# Patient Record
Sex: Female | Born: 1965 | Race: White | Hispanic: No | Marital: Married | Smoking: Never smoker
Health system: Southern US, Community
[De-identification: ages and names within clinical notes are randomized; demographics above are authoritative.]

## PROBLEM LIST (undated history)

## (undated) DIAGNOSIS — J189 Pneumonia, unspecified organism: Secondary | ICD-10-CM

## (undated) DIAGNOSIS — Z9889 Other specified postprocedural states: Secondary | ICD-10-CM

## (undated) DIAGNOSIS — T8859XA Other complications of anesthesia, initial encounter: Secondary | ICD-10-CM

## (undated) DIAGNOSIS — N809 Endometriosis, unspecified: Secondary | ICD-10-CM

## (undated) DIAGNOSIS — R112 Nausea with vomiting, unspecified: Secondary | ICD-10-CM

## (undated) DIAGNOSIS — T4145XA Adverse effect of unspecified anesthetic, initial encounter: Secondary | ICD-10-CM

## (undated) HISTORY — DX: Morbid (severe) obesity due to excess calories: E66.01

## (undated) HISTORY — PX: TONSILLECTOMY: SUR1361

## (undated) HISTORY — PX: APPENDECTOMY: SHX54

## (undated) HISTORY — DX: Endometriosis, unspecified: N80.9

## (undated) HISTORY — PX: LAPAROTOMY: SHX154

---

## 1999-04-30 ENCOUNTER — Other Ambulatory Visit: Admission: RE | Admit: 1999-04-30 | Discharge: 1999-04-30 | Payer: Self-pay | Admitting: Family Medicine

## 2006-06-24 ENCOUNTER — Ambulatory Visit: Payer: Self-pay | Admitting: Family Medicine

## 2006-06-24 LAB — CONVERTED CEMR LAB
AST: 16 units/L (ref 0–37)
BUN: 10 mg/dL (ref 6–23)
CO2: 28 meq/L (ref 19–32)
Chloride: 101 meq/L (ref 96–112)
Creatinine, Ser: 0.7 mg/dL (ref 0.4–1.2)
Eosinophils Absolute: 0.1 10*3/uL (ref 0.0–0.6)
Eosinophils Relative: 1 % (ref 0.0–5.0)
Folate: 15.1 ng/mL
GFR calc Af Amer: 119 mL/min
GFR calc non Af Amer: 99 mL/min
Lymphocytes Relative: 24.8 % (ref 12.0–46.0)
MCHC: 34.4 g/dL (ref 30.0–36.0)
MCV: 88.3 fL (ref 78.0–100.0)
Neutro Abs: 4.7 10*3/uL (ref 1.4–7.7)
Platelets: 261 10*3/uL (ref 150–400)
Potassium: 4 meq/L (ref 3.5–5.1)
Sodium: 135 meq/L (ref 135–145)
Total Bilirubin: 0.9 mg/dL (ref 0.3–1.2)
Total CHOL/HDL Ratio: 5.6
Total Protein: 6.8 g/dL (ref 6.0–8.3)
VLDL: 22 mg/dL (ref 0–40)
Vitamin B-12: 207 pg/mL — ABNORMAL LOW (ref 211–911)

## 2006-07-05 ENCOUNTER — Encounter: Admission: RE | Admit: 2006-07-05 | Discharge: 2006-07-05 | Payer: Self-pay | Admitting: Family Medicine

## 2006-07-13 ENCOUNTER — Other Ambulatory Visit: Admission: RE | Admit: 2006-07-13 | Discharge: 2006-07-13 | Payer: Self-pay | Admitting: Family Medicine

## 2006-07-13 ENCOUNTER — Ambulatory Visit: Payer: Self-pay | Admitting: Family Medicine

## 2006-07-13 ENCOUNTER — Encounter: Payer: Self-pay | Admitting: Family Medicine

## 2006-07-13 LAB — CONVERTED CEMR LAB: Homocysteine: 8.2 umol/L

## 2007-04-06 ENCOUNTER — Ambulatory Visit: Payer: Self-pay | Admitting: Internal Medicine

## 2007-04-06 DIAGNOSIS — J0100 Acute maxillary sinusitis, unspecified: Secondary | ICD-10-CM | POA: Insufficient documentation

## 2007-04-06 DIAGNOSIS — J019 Acute sinusitis, unspecified: Secondary | ICD-10-CM | POA: Insufficient documentation

## 2008-02-11 ENCOUNTER — Telehealth: Payer: Self-pay | Admitting: Internal Medicine

## 2008-02-13 ENCOUNTER — Encounter: Payer: Self-pay | Admitting: Emergency Medicine

## 2008-02-13 ENCOUNTER — Telehealth: Payer: Self-pay | Admitting: Family Medicine

## 2008-02-14 ENCOUNTER — Encounter: Payer: Self-pay | Admitting: Family Medicine

## 2008-02-14 ENCOUNTER — Inpatient Hospital Stay (HOSPITAL_COMMUNITY): Admission: EM | Admit: 2008-02-14 | Discharge: 2008-02-19 | Payer: Self-pay | Admitting: Internal Medicine

## 2008-02-14 ENCOUNTER — Ambulatory Visit: Payer: Self-pay | Admitting: Internal Medicine

## 2008-02-16 ENCOUNTER — Encounter: Payer: Self-pay | Admitting: Family Medicine

## 2008-02-22 ENCOUNTER — Ambulatory Visit: Payer: Self-pay | Admitting: Family Medicine

## 2008-02-22 DIAGNOSIS — N39498 Other specified urinary incontinence: Secondary | ICD-10-CM | POA: Insufficient documentation

## 2008-02-22 DIAGNOSIS — J189 Pneumonia, unspecified organism: Secondary | ICD-10-CM | POA: Insufficient documentation

## 2008-02-22 DIAGNOSIS — R252 Cramp and spasm: Secondary | ICD-10-CM | POA: Insufficient documentation

## 2008-02-22 DIAGNOSIS — N920 Excessive and frequent menstruation with regular cycle: Secondary | ICD-10-CM | POA: Insufficient documentation

## 2008-03-15 ENCOUNTER — Ambulatory Visit: Payer: Self-pay | Admitting: Family Medicine

## 2008-03-15 LAB — CONVERTED CEMR LAB
ALT: 61 units/L — ABNORMAL HIGH (ref 0–35)
BUN: 8 mg/dL (ref 6–23)
Basophils Absolute: 0.1 10*3/uL (ref 0.0–0.1)
Bilirubin, Direct: 0.1 mg/dL (ref 0.0–0.3)
Creatinine, Ser: 0.8 mg/dL (ref 0.4–1.2)
Eosinophils Absolute: 0.2 10*3/uL (ref 0.0–0.7)
FSH: 5.7 milliintl units/mL
GFR calc Af Amer: 102 mL/min
GFR calc non Af Amer: 84 mL/min
Glucose, Bld: 96 mg/dL (ref 70–99)
HCT: 40.9 % (ref 36.0–46.0)
HDL: 32.1 mg/dL — ABNORMAL LOW (ref >39.0)
Hemoglobin: 13.8 g/dL (ref 12.0–15.0)
INR: 1 (ref 0.8–1.0)
MCHC: 33.8 g/dL (ref 30.0–36.0)
MCV: 91.2 fL (ref 78.0–100.0)
Platelets: 235 10*3/uL (ref 150–400)
Potassium: 4.2 meq/L (ref 3.5–5.1)
Sodium: 141 meq/L (ref 135–145)
TSH: 1.07 microintl units/mL (ref 0.35–5.50)
Total Bilirubin: 0.8 mg/dL (ref 0.3–1.2)
Triglycerides: 153 mg/dL — ABNORMAL HIGH (ref 0–149)
VLDL: 31 mg/dL (ref 0–40)
WBC: 6.1 10*3/uL (ref 4.5–10.5)

## 2009-02-12 ENCOUNTER — Ambulatory Visit: Payer: Self-pay | Admitting: Family Medicine

## 2009-07-25 LAB — CONVERTED CEMR LAB: Pap Smear: NORMAL

## 2009-08-14 ENCOUNTER — Encounter: Admission: RE | Admit: 2009-08-14 | Discharge: 2009-08-14 | Payer: Self-pay | Admitting: Family Medicine

## 2009-11-07 ENCOUNTER — Ambulatory Visit: Payer: Self-pay | Admitting: Family Medicine

## 2009-11-07 DIAGNOSIS — H5789 Other specified disorders of eye and adnexa: Secondary | ICD-10-CM | POA: Insufficient documentation

## 2009-11-07 DIAGNOSIS — R0602 Shortness of breath: Secondary | ICD-10-CM | POA: Insufficient documentation

## 2009-11-11 ENCOUNTER — Telehealth: Payer: Self-pay | Admitting: Family Medicine

## 2010-05-17 ENCOUNTER — Encounter: Payer: Self-pay | Admitting: Interventional Radiology

## 2010-05-28 NOTE — Assessment & Plan Note (Signed)
Summary: PINK EYE/DLO   Vital Signs:  Patient profile:   45 year old female Weight:      191.25 pounds Temp:     99.1 degrees F oral Pulse rate:   78 / minute Pulse rhythm:   regular BP sitting:   110 / 80  (left arm) Cuff size:   large  Vitals Entered By: Janee Morn CMA (November 07, 2009 12:56 PM) CC: ? Pink eye (Left)   History of Present Illness:  Has noted gradual worseing of SOB.Marland Kitchen worse with heat and PMS...began after 2009 pneumonia and H1N1. No cough, no congestion, occ wheeze. SOB with exercisie.  Son and mother with asthma.  Acute Visit History:      The patient complains of eye symptoms.  She is having problems with the left eye.  The patient is having eye pain but no swelling.  Redness is involving the left eye and there is a crusting discharge.  There has been no history of trauma to the eyes.  The patient does not use contact lenses.  The vision has been blurred.        Problems Prior to Update: 1)  Screening For Lipoid Disorders  (ICD-V77.91) 2)  Leg Cramps  (ICD-729.82) 3)  Stress Incontinence  (ICD-788.39) 4)  Menorrhagia  (ICD-626.2) 5)  Pneumonia, Bilateral  (ICD-486) 6)  Sinusitis- Acute-nos  (ICD-461.9)  Current Medications (verified): 1)  Ibuprofen 200 Mg Tabs (Ibuprofen) .... As Needed 2)  Optivar 0.05 % Soln (Azelastine Hcl) .Marland Kitchen.. 1 Gtt in Both Eyes Two Times A Day  Allergies: 1)  ! * Spinal Epidural  Past History:  Past medical, surgical, family and social histories (including risk factors) reviewed, and no changes noted (except as noted below).  Family History: Reviewed history and no changes required.  Social History: Reviewed history and no changes required.  Review of Systems General:  Denies fatigue. Eyes:  sneexing and B ithcy eyes. CV:  Denies chest pain or discomfort. Resp:  Complains of shortness of breath; denies cough; stable SOB. GI:  Denies abdominal pain. GU:  Denies dysuria. Neuro:  no dizzyiess.  Physical Exam  General:   Well-developed,well-nourished,in no acute distress; alert,appropriate and cooperative throughout examination Head:  no maxillary sinus ttp Eyes:  b conjunctiva erythematous...neg flouroscein staining Ears:  External ear exam shows no significant lesions or deformities.  Otoscopic examination reveals clear canals, tympanic membranes are intact bilaterally without bulging, retraction, inflammation or discharge. Hearing is grossly normal bilaterally. Nose:  pale turbinates B , clear nasal discharge Mouth:  Oral mucosa and oropharynx without lesions or exudates.  Teeth in good repair. Neck:  no carotid bruit or thyromegaly no cervical or supraclavicular lymphadenopathy  Lungs:  Normal respiratory effort, chest expands symmetrically. Lungs are clear to auscultation, no crackles or wheezes. Heart:  Normal rate and regular rhythm. S1 and S2 normal without gallop, murmur, click, rub or other extra sounds.   Impression & Recommendations:  Problem # 1:  REDNESS OR DISCHARGE OF EYE (ICD-379.93) No evidence of foreign body. possible viral infection or more likely allergic cause. Start back loratidine and treat eyes with lubrication and optivar. Follow up if not improving.   Problem # 2:  DYSPNEA (ICD-786.05) Possibly due to allergies vs asthma. Eval with pulmonary lung function tests. Conisder lab eval at follow up if not improved and PFTs neg.  Orders: Radiology Referral (Radiology)  Complete Medication List: 1)  Ibuprofen 200 Mg Tabs (Ibuprofen) .... As needed 2)  Optivar 0.05 % Soln (Azelastine  hcl) .... 1 gtt in both eyes two times a day  Patient Instructions: 1)  Referral Appointment Information 2)  Day/Date: 3)  Time: 4)  Place/MD: 5)  Address: 6)  Phone/Fax: 7)  Patient given appointment information. Information/Orders faxed/mailed.  8)  Please schedule a follow-up appointment in 1 month..sometime after lung function tests done. 9)  Conisder trying loratidine daily.   Prescriptions: OPTIVAR 0.05 % SOLN (AZELASTINE HCL) 1 gtt in both eyes two times a day  #1 bottle x 0   Entered and Authorized by:   Kerby Nora MD   Signed by:   Kerby Nora MD on 11/07/2009   Method used:   Electronically to        [facility]* (retail)       [address]       [place], [place]  [address]       Ph: [phone]       Fax: [phone]   RxID:   [id number]   Current Allergies (reviewed today): ! * SPINAL EPIDURAL    PAP Result Date:  07/25/2009 PAP Result:  normal PAP Next Due:  1 yr

## 2010-05-28 NOTE — Progress Notes (Signed)
Summary: Pt Declined PFT referral   Phone Note Outgoing Call   Call placed by: Daine Gip,  November 11, 2009 5:00 PM Call placed to: Patient Summary of Call: Referral for PFT... Pt declined appt for PFT, says she did want to have this test at all. FYI, to Dr. Ermalene Searing.Marland KitchenMarland KitchenMarland KitchenDaine Gip  November 11, 2009 5:00 PM  Initial call taken by: Daine Gip,  November 11, 2009 5:00 PM  Follow-up for Phone Call        Notfiy pt that there is no other way to diagnose asthma...is dyspnea better? i am not sure why she agreed to this at last OV  and now refuses.  Follow-up by: Kerby Nora MD,  November 11, 2009 5:28 PM  Additional Follow-up for Phone Call Additional follow up Details #1::        Lmom for patient to return call  if still having difficulty breathing and advised her as instructed.. Additional Follow-up by: Benny Lennert CMA Duncan Dull),  November 13, 2009 10:13 AM

## 2010-07-13 ENCOUNTER — Other Ambulatory Visit: Payer: Self-pay | Admitting: Family Medicine

## 2010-07-13 DIAGNOSIS — Z1231 Encounter for screening mammogram for malignant neoplasm of breast: Secondary | ICD-10-CM

## 2010-08-17 ENCOUNTER — Ambulatory Visit
Admission: RE | Admit: 2010-08-17 | Discharge: 2010-08-17 | Disposition: A | Discharge status: Home / Self Care | Payer: PRIVATE HEALTH INSURANCE | Source: Ambulatory Visit | Attending: Family Medicine | Admitting: Family Medicine

## 2010-08-17 DIAGNOSIS — Z1231 Encounter for screening mammogram for malignant neoplasm of breast: Secondary | ICD-10-CM

## 2010-09-08 NOTE — Discharge Summary (Signed)
Sharon Chaney, Sharon Chaney             ACCOUNT NO.:  [id number]   MEDICAL RECORD NO.:  [id number]          PATIENT TYPE:  INP   LOCATION:  5503                         FACILITY:  [facility]   PHYSICIAN:  Valerie A. Felicity Coyer, MDDATE OF BIRTH:  11/15/1965   DATE OF ADMISSION:  02/14/2008  DATE OF DISCHARGE:  02/19/2008                               DISCHARGE SUMMARY   PRIMARY CARE PHYSICIAN:  Kerby Nora, MD   DISCHARGE DIAGNOSES:  1. Acute hypoxic respiratory failure in setting of extensive bilateral      pneumonia.  2. Menorrhagia.   HISTORY OF PRESENT ILLNESS:  Sharon Chaney is a 45 year old white female  with no past medical history who presented to [facility] Emergency Room  on day of admission with reports of worsening shortness of breath and  cough with occasional fever.  Upon evaluation in the emergency room, the  patient found to have a right middle lobe pneumonia with O2 sats 88% on  room air.  The patient was admitted at that time for further evaluation  and treatment.   PAST MEDICAL HISTORY:  None.   COURSE OF HOSPITALIZATION:  1. Bilateral pneumonia.  Again, the patient with marginal O2 sats at      time of admission, improved with oxygen by nasal cannula.  The      patient initially started on empiric Avelox at the time of      admission.  However, the patient felt this is causing some adverse      reaction with increased anxiety; therefore, the patient was      switched to IV Rocephin and azithromycin.  At this time, the      patient has completed course of azithromycin and will be discharged      home on continued Ceftin for a total of 10 days treatment.  Of note      in regards to the patient's hypoxia, CT angio of the chest was      obtained on February 16, 2008, revealing extensive bilateral      pneumonia with no pulmonary emboli.  The patient's O2 sats remained      stable on room air.  The patient felt medically stable for      discharge home.  Due to suspicion for  influenza versus H1N1, the      patient will complete empiric Tamiflu x5 days, however, unable to      test for H1N1 during this hospital stay, as the patient did not      qualify for infection control protocol.  2. Menorrhagia.  The patient reports heavy menstrual flow during      hospitalization.  Hemoglobin has remained stable; therefore, we      will defer further workup to the patient's primary care physician.   MEDICATIONS AT THE TIME OF DISCHARGE:  1. Tamiflu 75 mg p.o. b.i.d. until gone.  2. Ceftin 500 mg p.o. b.i.d. until gone.  3. Phenergan 25 mg p.o. q.i.d. p.r.n. nausea.  4. Phenergan With Codeine syrup 5 mL p.o. q.4 h. p.r.n. cough.   DISPOSITION:  The patient felt medically stable for  discharge home at  this time, as O2 sats were remaining stable on room air with ambulation.  The patient instructed to follow up with primary care physician, Dr. Kerby Nora on Thursday, February 22, 2008, at 9:15 a.m.      Cordelia Pen, NP      Raenette Rover. Felicity Coyer, MD  Electronically Signed    LE/MEDQ  D:  02/19/2008  T:  02/20/2008  Job:  [id number]   cc:   Kerby Nora, MD

## 2010-09-08 NOTE — H&P (Signed)
Sharon Chaney, Sharon Chaney             ACCOUNT NO.:  [id number]   MEDICAL RECORD NO.:  [id number]          PATIENT TYPE:  INP   LOCATION:  5503                         FACILITY:  [facility]   PHYSICIAN:  Michiel Cowboy, MDDATE OF BIRTH:  1965-12-18   DATE OF ADMISSION:  02/13/2008  DATE OF DISCHARGE:                              HISTORY & PHYSICAL   PRIMARY CARE PHYSICIAN:  [facility].   CHIEF COMPLAINT:  Coughing and shortness of breath.   HISTORY OF PRESENT ILLNESS:  The patient is an 45 year old female with  no past medical history who for the past couple weeks had been having  progressive worsening cough and shortness of breath with chest pain when  she coughs with productive cough and occasional fevers.  The patient  finally presented to the emergency department where she has official  right middle lobe pneumonia at which point [facility] hospitalist called for  admission for [place] since the patient has an oxygen requirement of at  least 2 liters.   REVIEW OF SYSTEMS:  Significant for nausea, diarrhea.  Occasional  burning when urinates and fevers, chills.  Overall fatigue and malaise.  Her children have been sick recently but now getting better.  She is  also endorses some headache.   SOCIAL HISTORY:  The patient has not smoked for quite some time and does  not drink alcohol.  Lives at home, has two kids, 21 and 48 year old, is  married.   FAMILY HISTORY:  Noncontributory.   MEDICATIONS:  The patient takes an occasional ibuprofen for headaches.  Otherwise nothing.   ALLERGIES:  Not allergic to anything.   PHYSICAL EXAMINATION:  VITALS:  Temperature 99.9, blood pressure 111/72,  pulse 100, respirations 15, sating 95% on 2 liters and 87-88% on room  air.  GENERAL:  This is a young female laying down in bed.  No acute distress,  but does feel lightheaded when sits up.  HEAD:  Nontraumatic, dry mucous membranes.  NECK:  Supple.  No lymphadenopathy noted.  LUNGS:   There is crackles bilaterally.  Some decreased air movement on  the right.  HEART:  Rapid but regular.  No murmurs appreciated.  ABDOMEN:  Soft, nontender, nondistended.  LOWER EXTREMITIES: Without clubbing, cyanosis or edema.  Strength 5/5 in  all four extremities.  Otherwise neurological exam was nonfocal.   LABORATORY DATA:  White blood cell count 5.2, hemoglobin 50.3.  Sodium  135, potassium 3.6, creatinine 1.1.  ABG was obtained showing 7.449,  bicarb 35 p.o. to 57.2, the patient has more than 100 of protein in her  urine.  Otherwise, no white blood cells, no significant bacteria.  Chest  x-ray showing right lateral lobe pneumonia.   ASSESSMENT/PLAN:  This is a 45 year old with pneumonia and O2 requiring.  1. Pneumonia.  Will admit, put on Avelox, will write for also      incentive spirometer, guaifenesin and albuterol nebs as needed.  2. Dehydration.  The patient has had decreased fluid take lately.      Will check orthostatics, give IV fluids.  3. Diarrhea.  Unsure etiology, but given the patient has  had a recent      pneumonia, will check for legionella, and also check white blood      cell count and stool culture, C. difficile.  4. Prophylaxis.  Protonix and Lovenox.      Michiel Cowboy, MD  Electronically Signed     AVD/MEDQ  D:  02/13/2008  T:  02/14/2008  Job:  [id number]   cc:   [facility]

## 2011-01-05 ENCOUNTER — Other Ambulatory Visit: Payer: Self-pay

## 2011-01-26 LAB — CULTURE, BLOOD (ROUTINE X 2): Culture: NO GROWTH

## 2011-01-26 LAB — COMPREHENSIVE METABOLIC PANEL
ALT: 27
AST: 36
AST: 39 — ABNORMAL HIGH
BUN: 3 — ABNORMAL LOW
CO2: 26
Calcium: 7.3 — ABNORMAL LOW
Calcium: 7.9 — ABNORMAL LOW
Chloride: 105
Chloride: 108
Creatinine, Ser: 0.74
Creatinine, Ser: 0.85
GFR calc Af Amer: >60
GFR calc non Af Amer: >60
GFR calc non Af Amer: >60
Glucose, Bld: 105 — ABNORMAL HIGH
Potassium: 3 — ABNORMAL LOW
Potassium: 3.5
Total Bilirubin: 0.4
Total Protein: 5.3 — ABNORMAL LOW
Total Protein: 5.4 — ABNORMAL LOW

## 2011-01-26 LAB — FECAL LACTOFERRIN, QUANT: Fecal Lactoferrin: POSITIVE

## 2011-01-26 LAB — PROTIME-INR: Prothrombin Time: 13.9

## 2011-01-26 LAB — DIFFERENTIAL
Basophils Relative: 0
Eosinophils Absolute: 0
Monocytes Absolute: 0.1
Monocytes Relative: 2 — ABNORMAL LOW
Neutro Abs: 4.6

## 2011-01-26 LAB — BASIC METABOLIC PANEL
Calcium: 8 — ABNORMAL LOW
Calcium: 8.6
Creatinine, Ser: 0.74
GFR calc Af Amer: >60
GFR calc non Af Amer: >60
Glucose, Bld: 94
Sodium: 141

## 2011-01-26 LAB — LEGIONELLA PNEUMOPHILA ANTIBODIES: Legionella pneumo, IgG Type 1-6: <1:128 {titer}

## 2011-01-26 LAB — CBC
HCT: 39.1
Hemoglobin: 12.6
Hemoglobin: 13.4
Hemoglobin: 13.7
Hemoglobin: 15
MCHC: 33.8
MCHC: 34.3
MCV: 89.8
Platelets: 168
RBC: 4.16
RBC: 4.41
RBC: 4.93
RDW: 14.1
WBC: 4.6

## 2011-01-26 LAB — URINALYSIS, ROUTINE W REFLEX MICROSCOPIC
Bilirubin Urine: NEGATIVE
Glucose, UA: NEGATIVE
Hgb urine dipstick: NEGATIVE
Ketones, ur: >80 — AB
Specific Gravity, Urine: 1.025
pH: 6

## 2011-01-26 LAB — BLOOD GAS, ARTERIAL
Acid-Base Excess: 0.9
Bicarbonate: 23.7
TCO2: 20.7
pCO2 arterial: 35

## 2011-01-26 LAB — POCT I-STAT, CHEM 8
Calcium, Ion: 1.07 — ABNORMAL LOW
Chloride: 98
Creatinine, Ser: 1.1
Glucose, Bld: 115 — ABNORMAL HIGH
HCT: 45

## 2011-01-26 LAB — STOOL CULTURE

## 2011-01-26 LAB — URINE MICROSCOPIC-ADD ON

## 2011-01-26 LAB — URINE CULTURE

## 2011-06-03 ENCOUNTER — Ambulatory Visit (INDEPENDENT_AMBULATORY_CARE_PROVIDER_SITE_OTHER): Payer: PRIVATE HEALTH INSURANCE | Admitting: Family Medicine

## 2011-06-03 ENCOUNTER — Encounter: Payer: Self-pay | Admitting: Family Medicine

## 2011-06-03 DIAGNOSIS — J019 Acute sinusitis, unspecified: Secondary | ICD-10-CM

## 2011-06-03 MED ORDER — AMOXICILLIN 875 MG PO TABS: 875.0000 mg | ORAL_TABLET | Freq: Two times a day (BID) | ORAL | Status: AC

## 2011-06-03 NOTE — Patient Instructions (Addendum)
I would get a flu shot each fall.   Start the antibiotics if not better in a few days.  Take care.

## 2011-06-03 NOTE — Progress Notes (Signed)
Mult sick contacts.  Started 3 weeks ago.  Started with HA, then ST, ear pain and now with 1 week of eye discharge and irritation, both eyes.  Fever this weekend, ~100, none now.  She feels some better now.  Pain in chest from cough.  Sputum, green and bloody.    Meds, vitals, and allergies reviewed.   ROS: See HPI.  Otherwise, noncontributory.  GEN: nad, alert and oriented HEENT: mucous membranes moist, tm w/o erythema but R SOM noted, nasal exam w/o erythema, clear discharge noted,  OP with cobblestoning, B eyes minimally injected- limbus sparring.  NECK: supple w/o LA CV: rrr.   PULM: ctab, no inc wob EXT: no edema SKIN: no acute rash

## 2011-06-04 NOTE — Assessment & Plan Note (Signed)
She does have max/frontal sinus ttp B.  We talked about options.  Nontoxic.  Okay for hold amoxil for now and start if not improved in next few days. She prefers that plan.  Supportive tx o/w.  She agrees. Call back as needed. Cont mucinex and nasal wash.

## 2011-06-15 ENCOUNTER — Telehealth: Payer: Self-pay | Admitting: Family Medicine

## 2011-06-15 MED ORDER — FLUCONAZOLE 150 MG PO TABS: 150.0000 mg | ORAL_TABLET | Freq: Once | ORAL | Status: AC

## 2011-06-15 NOTE — Telephone Encounter (Signed)
My standard protocol is that if the pt has had recent antibiotics.. I go ahead and treat with diflucan 150 mg po x1 .  If they have not had antibiotics they need to be seen to determine if it is yeast, BV or trich. In this case, as long as Dr. Salena Saner does not object.. Send in above prescription.

## 2011-06-15 NOTE — Telephone Encounter (Signed)
Patient advised and rx sent to pharmacy  

## 2011-06-15 NOTE — Telephone Encounter (Signed)
Triage Record Num: [id number] Operator: Tomasita Crumble Patient Name: Sharon Chaney Call Date & Time: 06/15/2011 10:46:50AM Patient Phone: [phone] PCP: Patient Gender: Female PCP Fax : Patient DOB: 1966/04/07 Practice Name: [facility] Reason for Call: Caller: Krystan/Patient; PCP: Excell Seltzer.; CB#: [phone]; Call regarding Vaginal Irritation; Denies pregnancy. Reports recent Amoxicillin use. Asks for med for yeast- has burning, itching, "usual after I have an antibiotic thing". Denies visible discharge. Has tried 3 day miconazole without improvement. OFFICE, PT REQUESTS RX FOR YEAST INFECTION AFTER TAKING ATBS. PLEASE FOLLOW UP; HER CALLBACK NUMBER IS ABOVE. USES [facility] [phone]. CALLER STATES, IF I HAVE TO BE SEEN JUST FORGET IT.... Protocol(s) Used: Vaginal Discharge or Irritation Recommended Outcome per Protocol: See Provider within 24 hours Reason for Outcome: Discharge or vaginal symptoms worsening despite 72 hours or more of treatment Care Advice: ~ To help relieve itching/irritation, try a cool compress to vulva using a washcloth for 10-15 minutes. If pregnancy is known or suspected, get advice from provider before using any nonprescription medication other than acetaminophen ~ Refrain from douching, using scented deodorant tampons, or nonprescription medication until evaluated by provider. Do not use feminine hygiene sprays. Use condoms during sex. ~ 06/15/2011 10:56:11AM Page 1 of 1 [id number]

## 2011-06-29 ENCOUNTER — Inpatient Hospital Stay (HOSPITAL_COMMUNITY)
Admission: AD | Admit: 2011-06-29 | Discharge: 2011-06-29 | Disposition: A | Discharge status: Home / Self Care | Payer: PRIVATE HEALTH INSURANCE | Source: Ambulatory Visit | Attending: Obstetrics & Gynecology | Admitting: Obstetrics & Gynecology

## 2011-06-29 ENCOUNTER — Encounter (HOSPITAL_COMMUNITY): Payer: Self-pay | Admitting: *Deleted

## 2011-06-29 DIAGNOSIS — A638 Other specified predominantly sexually transmitted diseases: Secondary | ICD-10-CM | POA: Insufficient documentation

## 2011-06-29 HISTORY — DX: Other complications of anesthesia, initial encounter: T88.59XA

## 2011-06-29 HISTORY — DX: Nausea with vomiting, unspecified: R11.2

## 2011-06-29 HISTORY — DX: Other specified postprocedural states: Z98.890

## 2011-06-29 HISTORY — DX: Adverse effect of unspecified anesthetic, initial encounter: T41.45XA

## 2011-06-29 MED ORDER — CEFTRIAXONE SODIUM 250 MG IJ SOLR
250.0000 mg | Freq: Once | INTRAMUSCULAR | Status: AC
  Administered 2011-06-29: 250 mg via INTRAMUSCULAR
  Filled 2011-06-29: qty 250

## 2011-06-29 NOTE — Progress Notes (Signed)
Pt states, " I am supposed to get a shot only."

## 2011-06-29 NOTE — ED Notes (Signed)
Attempted to call office for order, no medical personnel present, will return call when back from lunch.

## 2011-08-31 ENCOUNTER — Other Ambulatory Visit: Payer: Self-pay | Admitting: Family Medicine

## 2011-08-31 DIAGNOSIS — Z1231 Encounter for screening mammogram for malignant neoplasm of breast: Secondary | ICD-10-CM

## 2011-09-23 ENCOUNTER — Ambulatory Visit
Admission: RE | Admit: 2011-09-23 | Discharge: 2011-09-23 | Disposition: A | Discharge status: Home / Self Care | Payer: PRIVATE HEALTH INSURANCE | Source: Ambulatory Visit | Attending: Family Medicine | Admitting: Family Medicine

## 2011-09-23 DIAGNOSIS — Z1231 Encounter for screening mammogram for malignant neoplasm of breast: Secondary | ICD-10-CM

## 2011-09-23 LAB — HM MAMMOGRAPHY: HM Mammogram: NEGATIVE

## 2011-09-24 ENCOUNTER — Encounter: Payer: Self-pay | Admitting: Family Medicine

## 2012-09-15 ENCOUNTER — Other Ambulatory Visit: Payer: Self-pay

## 2012-09-15 DIAGNOSIS — Z1231 Encounter for screening mammogram for malignant neoplasm of breast: Secondary | ICD-10-CM

## 2012-10-19 ENCOUNTER — Ambulatory Visit
Admission: RE | Admit: 2012-10-19 | Discharge: 2012-10-19 | Disposition: A | Discharge status: Home / Self Care | Payer: PRIVATE HEALTH INSURANCE | Source: Ambulatory Visit

## 2012-10-19 DIAGNOSIS — Z1231 Encounter for screening mammogram for malignant neoplasm of breast: Secondary | ICD-10-CM

## 2014-02-25 ENCOUNTER — Encounter (HOSPITAL_COMMUNITY): Payer: Self-pay | Admitting: *Deleted

## 2014-07-03 ENCOUNTER — Ambulatory Visit (INDEPENDENT_AMBULATORY_CARE_PROVIDER_SITE_OTHER): Payer: PRIVATE HEALTH INSURANCE | Admitting: Internal Medicine

## 2014-07-03 ENCOUNTER — Encounter: Payer: Self-pay | Admitting: Internal Medicine

## 2014-07-03 ENCOUNTER — Other Ambulatory Visit (HOSPITAL_COMMUNITY)
Admission: RE | Admit: 2014-07-03 | Discharge: 2014-07-03 | Disposition: A | Discharge status: Home / Self Care | Payer: PRIVATE HEALTH INSURANCE | Source: Ambulatory Visit | Attending: Internal Medicine | Admitting: Internal Medicine

## 2014-07-03 VITALS — BP 134/86 | HR 93 | Temp 99.0°F | Wt 189.0 lb

## 2014-07-03 DIAGNOSIS — Z01419 Encounter for gynecological examination (general) (routine) without abnormal findings: Secondary | ICD-10-CM

## 2014-07-03 DIAGNOSIS — Z1151 Encounter for screening for human papillomavirus (HPV): Secondary | ICD-10-CM | POA: Insufficient documentation

## 2014-07-03 DIAGNOSIS — N926 Irregular menstruation, unspecified: Secondary | ICD-10-CM

## 2014-07-03 DIAGNOSIS — N898 Other specified noninflammatory disorders of vagina: Secondary | ICD-10-CM

## 2014-07-03 LAB — POCT URINE PREGNANCY: PREG TEST UR: NEGATIVE

## 2014-07-03 LAB — HM PAP SMEAR: HM Pap smear: 0

## 2014-07-03 NOTE — Patient Instructions (Signed)
Abnormal Uterine Bleeding Abnormal uterine bleeding can affect women at various stages in life, including teenagers, women in their reproductive years, pregnant women, and women who have reached menopause. Several kinds of uterine bleeding are considered abnormal, including:  Bleeding or spotting between periods.   Bleeding after sexual intercourse.   Bleeding that is heavier or more than normal.   Periods that last longer than usual.  Bleeding after menopause.  Many cases of abnormal uterine bleeding are minor and simple to treat, while others are more serious. Any type of abnormal bleeding should be evaluated by your health care provider. Treatment will depend on the cause of the bleeding. HOME CARE INSTRUCTIONS Monitor your condition for any changes. The following actions may help to alleviate any discomfort you are experiencing:  Avoid the use of tampons and douches as directed by your health care provider.  Change your pads frequently. You should get regular pelvic exams and Pap tests. Keep all follow-up appointments for diagnostic tests as directed by your health care provider.  SEEK MEDICAL CARE IF:   Your bleeding lasts more than 1 week.   You feel dizzy at times.  SEEK IMMEDIATE MEDICAL CARE IF:   You pass out.   You are changing pads every 15 to 30 minutes.   You have abdominal pain.  You have a fever.   You become sweaty or weak.   You are passing large blood clots from the vagina.   You start to feel nauseous and vomit. MAKE SURE YOU:   Understand these instructions.  Will watch your condition.  Will get help right away if you are not doing well or get worse. Document Released: 04/12/2005 Document Revised: 04/17/2013 Document Reviewed: 11/09/2012 ExitCare Patient Information 2015 [employer]. This information is not intended to replace advice given to you by your health care provider. Make sure you discuss any questions you have with your  health care provider.  

## 2014-07-03 NOTE — Progress Notes (Signed)
Subjective:    Patient ID: Sharon Chaney, female    DOB: [birth year], 49 y.o.   MRN: [id number]  HPI  Pt presents to the clinic today with c/o vaginal discharge. She reports around the time she would have normally had her period 06/24/14, she had bright red bleeding for one day. Her discharge then turned clear and thick. A few days later her discharge was brown with clots. This abnormal discharge lasted about 5-7 days but has now resolved. Her LMP (normal): 1/28. Last pap smear 2012-normal. She denies abdominal pain, cramping, pain with intercourse or urinary symptoms.  Review of Systems      Past Medical History  Diagnosis Date  . Complication of anesthesia   . PONV (postoperative nausea and vomiting)     Current Outpatient Prescriptions  Medication Sig Dispense Refill  . ibuprofen (ADVIL,MOTRIN) 200 MG tablet Take 200 mg by mouth every 6 (six) hours as needed.    . loratadine (CLARITIN) 10 MG tablet Take 10 mg by mouth daily.    Marland Kitchen omeprazole (PRILOSEC) 20 MG capsule Take 20 mg by mouth daily as needed. For heartburn    . Multiple Vitamin (MULTIVITAMIN) tablet Take 1 tablet by mouth daily.     No current facility-administered medications for this visit.    Allergies  Allergen Reactions  . Latex Rash    Family History  Problem Relation Age of Onset  . Anesthesia problems Neg Hx   . Hypotension Neg Hx   . Malignant hyperthermia Neg Hx     History   Social History  . Marital Status: Married    Spouse Name: N/A  . Number of Children: N/A  . Years of Education: N/A   Occupational History  . Not on file.   Social History Main Topics  . Smoking status: Never Smoker   . Smokeless tobacco: Never Used  . Alcohol Use: No  . Drug Use: No  . Sexual Activity: Yes   Other Topics Concern  . Not on file   Social History Narrative     Constitutional: Denies fever, malaise, fatigue, headache or abrupt weight changes.  Respiratory: Denies difficulty breathing,  shortness of breath, cough or sputum production.   Cardiovascular: Denies chest pain, chest tightness, palpitations or swelling in the hands or feet.  Gastrointestinal: Denies abdominal pain, bloating, constipation, diarrhea or blood in the stool.  GU: Pt reports vaginal discharge. Denies urgency, frequency, pain with urination, burning sensation, blood in urine, odor.   No other specific complaints in a complete review of systems (except as listed in HPI above).  Objective:   Physical Exam   BP 134/86 mmHg  Pulse 93  Temp(Src) 99 F (37.2 C) (Oral)  Wt 189 lb (85.73 kg)  SpO2 99%  LMP 05/23/2014 Wt Readings from Last 3 Encounters:  07/03/14 189 lb (85.73 kg)  06/29/11 197 lb 4 oz (89.472 kg)  06/03/11 198 lb (89.812 kg)    General: Appears her stated age, well developed, well nourished in NAD. Skin: Warm, dry and intact. No rashes, lesions or ulcerations noted. Cardiovascular: Normal rate and rhythm. S1,S2 noted.  No murmur, rubs or gallops noted.  Pulmonary/Chest: Normal effort and positive vesicular breath sounds. No respiratory distress. No wheezes, rales or ronchi noted.  Abdomen: Soft and nontender. Normal bowel sounds, no bruits noted. No distention or masses noted. Pelvic: Normal female anatomy. Small amount of this white discharge noted. No CMT. Adnexa non palpable.  BMET    Component Value Date/Time  NA 141 03/15/2008 1012   K 4.2 03/15/2008 1012   CL 109 03/15/2008 1012   CO2 26 03/15/2008 1012   GLUCOSE 96 03/15/2008 1012   BUN 8 03/15/2008 1012   CREATININE 0.8 03/15/2008 1012   CALCIUM 9.2 03/15/2008 1012   GFRNONAA 84 03/15/2008 1012   GFRAA 102 03/15/2008 1012    Lipid Panel     Component Value Date/Time   CHOL 212* 03/15/2008 1012   TRIG 153* 03/15/2008 1012   HDL 32.1* 03/15/2008 1012   CHOLHDL 6.6 CALC 03/15/2008 1012   VLDL 31 03/15/2008 1012    CBC    Component Value Date/Time   WBC 6.1 03/15/2008 1012   RBC 4.49 03/15/2008 1012    HGB 13.8 03/15/2008 1012   HCT 40.9 03/15/2008 1012   PLT 235 03/15/2008 1012   MCV 91.2 03/15/2008 1012   MCHC 33.8 03/15/2008 1012   RDW 14.2 03/15/2008 1012   LYMPHSABS 0.5* 02/13/2008 1740   MONOABS 0.4 03/15/2008 1012   EOSABS 0.2 03/15/2008 1012   BASOSABS 0.1 03/15/2008 1012    Hgb A1C No results found for: HGBA1C      Assessment & Plan:   Abnormal Menses:  Urine hcg: negative ? If she is perimenopausal Because this only occurred once, will monitor for now and see how her next menstrual cycle is.  Vaginal Discharge:  Looks like normal physiological discharge Wet prep: - yeast, clue or trich Pap obtained- will call you with the results  RTC as needed or if symptoms persist or worsen

## 2014-07-03 NOTE — Progress Notes (Signed)
Pre visit review using our clinic review tool, if applicable. No additional management support is needed unless otherwise documented below in the visit note. 

## 2014-07-04 ENCOUNTER — Other Ambulatory Visit: Payer: Self-pay

## 2014-07-04 ENCOUNTER — Ambulatory Visit
Admission: RE | Admit: 2014-07-04 | Discharge: 2014-07-04 | Disposition: A | Discharge status: Home / Self Care | Payer: PRIVATE HEALTH INSURANCE | Source: Ambulatory Visit

## 2014-07-04 DIAGNOSIS — Z1231 Encounter for screening mammogram for malignant neoplasm of breast: Secondary | ICD-10-CM

## 2014-07-05 LAB — CYTOLOGY - PAP

## 2014-07-16 ENCOUNTER — Ambulatory Visit: Payer: PRIVATE HEALTH INSURANCE | Admitting: Family Medicine

## 2014-08-01 ENCOUNTER — Encounter: Payer: Self-pay | Admitting: Family Medicine

## 2014-08-01 ENCOUNTER — Ambulatory Visit (INDEPENDENT_AMBULATORY_CARE_PROVIDER_SITE_OTHER): Payer: PRIVATE HEALTH INSURANCE | Admitting: Family Medicine

## 2014-08-01 VITALS — BP 110/76 | HR 88 | Temp 98.5°F | Ht 64.0 in | Wt 190.8 lb

## 2014-08-01 DIAGNOSIS — R42 Dizziness and giddiness: Secondary | ICD-10-CM | POA: Diagnosis not present

## 2014-08-01 DIAGNOSIS — R102 Pelvic and perineal pain unspecified side: Secondary | ICD-10-CM | POA: Insufficient documentation

## 2014-08-01 DIAGNOSIS — N809 Endometriosis, unspecified: Secondary | ICD-10-CM

## 2014-08-01 DIAGNOSIS — J4520 Mild intermittent asthma, uncomplicated: Secondary | ICD-10-CM | POA: Insufficient documentation

## 2014-08-01 DIAGNOSIS — Z13220 Encounter for screening for lipoid disorders: Secondary | ICD-10-CM

## 2014-08-01 DIAGNOSIS — Z0000 Encounter for general adult medical examination without abnormal findings: Secondary | ICD-10-CM | POA: Diagnosis not present

## 2014-08-01 DIAGNOSIS — G8929 Other chronic pain: Secondary | ICD-10-CM | POA: Insufficient documentation

## 2014-08-01 DIAGNOSIS — K219 Gastro-esophageal reflux disease without esophagitis: Secondary | ICD-10-CM

## 2014-08-01 DIAGNOSIS — N39498 Other specified urinary incontinence: Secondary | ICD-10-CM | POA: Diagnosis not present

## 2014-08-01 DIAGNOSIS — N949 Unspecified condition associated with female genital organs and menstrual cycle: Secondary | ICD-10-CM

## 2014-08-01 LAB — CBC WITH DIFFERENTIAL/PLATELET
BASOS PCT: 0.3 % (ref 0.0–3.0)
Basophils Absolute: 0 10*3/uL (ref 0.0–0.1)
EOS ABS: 0.1 10*3/uL (ref 0.0–0.7)
Eosinophils Relative: 1 % (ref 0.0–5.0)
HCT: 43.9 % (ref 36.0–46.0)
HEMOGLOBIN: 14.7 g/dL (ref 12.0–15.0)
LYMPHS PCT: 25 % (ref 12.0–46.0)
Lymphs Abs: 2.2 10*3/uL (ref 0.7–4.0)
MCHC: 33.5 g/dL (ref 30.0–36.0)
MCV: 88.9 fl (ref 78.0–100.0)
Monocytes Absolute: 0.5 10*3/uL (ref 0.1–1.0)
Monocytes Relative: 5.6 % (ref 3.0–12.0)
NEUTROS ABS: 6 10*3/uL (ref 1.4–7.7)
Neutrophils Relative %: 68.1 % (ref 43.0–77.0)
Platelets: 308 10*3/uL (ref 150.0–400.0)
RBC: 4.94 Mil/uL (ref 3.87–5.11)
RDW: 15 % (ref 11.5–15.5)
WBC: 8.7 10*3/uL (ref 4.0–10.5)

## 2014-08-01 LAB — COMPREHENSIVE METABOLIC PANEL
ALBUMIN: 4.3 g/dL (ref 3.5–5.2)
ALK PHOS: 47 U/L (ref 39–117)
ALT: 12 U/L (ref 0–35)
AST: 13 U/L (ref 0–37)
BILIRUBIN TOTAL: 0.5 mg/dL (ref 0.2–1.2)
BUN: 11 mg/dL (ref 6–23)
CALCIUM: 9.7 mg/dL (ref 8.4–10.5)
CO2: 25 mEq/L (ref 19–32)
CREATININE: 1.03 mg/dL (ref 0.40–1.20)
Chloride: 105 mEq/L (ref 96–112)
GFR: 60.73 mL/min (ref >60.00)
Glucose, Bld: 100 mg/dL — ABNORMAL HIGH (ref 70–99)
Potassium: 4.4 mEq/L (ref 3.5–5.1)
Sodium: 136 mEq/L (ref 135–145)
Total Protein: 7.2 g/dL (ref 6.0–8.3)

## 2014-08-01 LAB — LIPID PANEL
CHOL/HDL RATIO: 5
Cholesterol: 220 mg/dL — ABNORMAL HIGH (ref 0–200)
HDL: 44.7 mg/dL (ref >39.00)
LDL Cholesterol: 153 mg/dL — ABNORMAL HIGH (ref 0–99)
NONHDL: 175.3
Triglycerides: 114 mg/dL (ref 0.0–149.0)
VLDL: 22.8 mg/dL (ref 0.0–40.0)

## 2014-08-01 LAB — TSH: TSH: 2.61 u[IU]/mL (ref 0.35–4.50)

## 2014-08-01 NOTE — Assessment & Plan Note (Signed)
No current issue. Was told this was likely after in hospital in 2009 for H1N1 PNA.  Is somewhat SOB during her menses at times.

## 2014-08-01 NOTE — Assessment & Plan Note (Addendum)
Controlled with diet. Uses prilosec 20 mg daily.

## 2014-08-01 NOTE — Progress Notes (Signed)
Pre visit review using our clinic review tool, if applicable. No additional management support is needed unless otherwise documented below in the visit note. 

## 2014-08-01 NOTE — Assessment & Plan Note (Signed)
eval with labs.

## 2014-08-01 NOTE — Assessment & Plan Note (Signed)
Not bothering her much at this time.

## 2014-08-01 NOTE — Assessment & Plan Note (Signed)
Fairly well controlled with home remedies and diet control. If interested in further eval for interstitial cystitis, she will let  Me know.

## 2014-08-01 NOTE — Progress Notes (Signed)
Subjective:    Patient ID: Sharon Chaney, female    DOB: Jul 10, 1965, 49 y.o.   MRN: [id number]  HPI   49 year old female presents to re-establish care.   She was seen for DUB (missed menses) by Cecille Po on 3/9. U preg, neg, nml pap, nml wet prep.  ? Due to  Perimenopausal bleeding. She did end up having menses on 07/11/2014.  BP Readings from Last 3 Encounters:  08/01/14 110/76  07/03/14 134/86  06/29/11 120/74   She has chronic pelvic pain for years, since teenager. Has seen Gyn ( Dr. Rogue Bussing, [facility]).  Had exp lap for endometriosis, removed. Pain improved for a time. Pain worse with menses, but constant. Pain with intercourse. Abdominal sharp pains with certain foods. Had STD testing, nml UA Has nml pelvic US treated with multiple different regimens. She was uncomfortable with GYNs care. Does not want to return there.  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   She on her own has tried to decreased sugar, lactose and acid in diet. Occ using tylenol.  She thinks she had shingles right flank last week.. Blisters, painful unilateral rash.  Last CPX, labs  In 2011. Due  For Tdap  Mammo:  Nml 2016 HIV: tested in past Colon: Fam hx.  Not due until age 25  History  Substance Use Topics  . Smoking status: Never Smoker   . Smokeless tobacco: Never Used  . Alcohol Use: No   family history includes Arthritis in her maternal grandmother; Colon cancer (age of onset: 56) in her father; Diabetes in her paternal grandmother; Heart disease in her father and paternal grandmother; Stroke in her paternal grandfather. There is no history of Anesthesia problems, Hypotension, or Malignant hyperthermia.  Review of Systems  Constitutional: Negative for fever, fatigue and unexpected weight change.  HENT: Negative for congestion, ear pain, sinus pressure, sneezing, sore throat and trouble swallowing.   Eyes: Negative for pain and itching.    Respiratory: Negative for cough, shortness of breath and wheezing.   Cardiovascular: Negative for chest pain, palpitations and leg swelling.  Gastrointestinal: Negative for nausea, abdominal pain, diarrhea, constipation and blood in stool.  Genitourinary: Negative for dysuria, hematuria, vaginal discharge, difficulty urinating and menstrual problem.  Skin: Negative for rash.  Neurological: Positive for dizziness. Negative for syncope, weakness, light-headedness, numbness and headaches.  Psychiatric/Behavioral: Positive for sleep disturbance. Negative for confusion and dysphoric mood. The patient is not nervous/anxious.        Some increase in stress       Objective:   Physical Exam  Constitutional: Vital signs are normal. She appears well-developed and well-nourished. She is cooperative.  Non-toxic appearance. She does not appear ill. No distress.  HENT:  Head: Normocephalic.  Right Ear: Hearing, tympanic membrane, external ear and ear canal normal.  Left Ear: Hearing, tympanic membrane, external ear and ear canal normal.  Nose: Nose normal.  Eyes: Conjunctivae, EOM and lids are normal. Pupils are equal, round, and reactive to light. Lids are everted and swept, no foreign bodies found.  Neck: Trachea normal and normal range of motion. Neck supple. Carotid bruit is not present. No thyroid mass and no thyromegaly present.  Cardiovascular: Normal rate, regular rhythm, S1 normal, S2 normal, normal heart sounds and intact distal pulses.  Exam reveals no gallop.   No murmur heard. Pulmonary/Chest: Effort normal and breath sounds normal. No respiratory distress. She has no wheezes. She has no rhonchi. She has no  rales.  Abdominal: Soft. Normal appearance and bowel sounds are normal. She exhibits no distension, no fluid wave, no abdominal bruit and no mass. There is no hepatosplenomegaly. There is no tenderness. There is no rebound, no guarding and no CVA tenderness. No hernia.  Genitourinary: No  breast swelling, tenderness, discharge or bleeding. Pelvic exam was performed with patient supine.  Pap and vaginal exam done at recent OV.  Lymphadenopathy:    She has no cervical adenopathy.    She has no axillary adenopathy.  Neurological: She is alert. She has normal strength. No cranial nerve deficit or sensory deficit.  Skin: Skin is warm, dry and intact. No rash noted.  Psychiatric: Her speech is normal and behavior is normal. Judgment normal. Her mood appears not anxious. Cognition and memory are normal. She does not exhibit a depressed mood.          Assessment & Plan:  The patient's preventative maintenance and recommended screening tests for an annual wellness exam were reviewed in full today. Brought up to date unless services declined.  Counselled on the importance of diet, exercise, and its role in overall health and mortality. The patient's FH and SH was reviewed, including their home life, tobacco status, and drug and alcohol status.   Last CPX, labs  In 2011. Due  For Tdap  Mammo:  Nml 2016 HIV: tested in past Colon: Fam hx.  Not due until age 35

## 2014-08-01 NOTE — Patient Instructions (Addendum)
Stop at lab on way out.  Work on [employer], regular exercise and weight loss.

## 2014-08-02 ENCOUNTER — Encounter: Payer: Self-pay | Admitting: *Deleted

## 2015-02-22 ENCOUNTER — Encounter: Payer: Self-pay | Admitting: Family Medicine

## 2015-02-22 ENCOUNTER — Ambulatory Visit (INDEPENDENT_AMBULATORY_CARE_PROVIDER_SITE_OTHER): Payer: PRIVATE HEALTH INSURANCE | Admitting: Family Medicine

## 2015-02-22 VITALS — BP 132/90 | HR 81 | Temp 98.6°F | Ht 64.0 in | Wt 180.0 lb

## 2015-02-22 DIAGNOSIS — G43809 Other migraine, not intractable, without status migrainosus: Secondary | ICD-10-CM

## 2015-02-22 DIAGNOSIS — J0100 Acute maxillary sinusitis, unspecified: Secondary | ICD-10-CM | POA: Diagnosis not present

## 2015-02-22 MED ORDER — AMOXICILLIN-POT CLAVULANATE 875-125 MG PO TABS: 1.0000 | ORAL_TABLET | Freq: Two times a day (BID) | ORAL | Status: AC

## 2015-02-22 MED ORDER — ONDANSETRON 8 MG PO TBDP: 8.0000 mg | ORAL_TABLET | Freq: Three times a day (TID) | ORAL | Status: DC | PRN

## 2015-02-22 NOTE — Progress Notes (Signed)
   Subjective:    Patient ID: Sharon Chaney, female    DOB: July 09, 1965, 49 y.o.   MRN: [id number]  HPI  Patient seen with headaches. She does have history of reported migraines but states this headache onset yesterday morning at 4 AM. Slightly better today but still 6 out of 10. Yesterday 10 out of 10. She describes a throbbing headache left retro-orbital and somewhat generalized now. She has some nausea without vomiting and photosensitivity. She was concerned about possible sinusitis. She's had some left maxillary and left ethmoid sinus pressure. No purulent secretions. No fevers or chills. She had some slight nausea this morning but no vomiting.  She thinks her headaches are triggered frequently by changes in barometric pressure. She's tried tryptans in the past but did not have good tolerance.  Past Medical History  Diagnosis Date  . Complication of anesthesia   . PONV (postoperative nausea and vomiting)   . Endometriosis    Past Surgical History  Procedure Laterality Date  . Cesarean section    . Laparotomy    . Appendectomy      reports that she has never smoked. She has never used smokeless tobacco. She reports that she does not drink alcohol or use illicit drugs. family history includes Arthritis in her maternal grandmother; Colon cancer (age of onset: 76) in her father; Diabetes in her paternal grandmother; Heart disease in her father and paternal grandmother; Stroke in her paternal grandfather. There is no history of Anesthesia problems, Hypotension, or Malignant hyperthermia. Allergies  Allergen Reactions  . Latex Rash     Review of Systems  Constitutional: Negative for fever and chills.  HENT: Positive for congestion and sinus pressure. Negative for sore throat.        Objective:   Physical Exam  Constitutional: She is oriented to person, place, and time. She appears well-developed and well-nourished.  HENT:  Right Ear: External ear normal.  Left Ear: External  ear normal.  Mouth/Throat: Oropharynx is clear and moist.  Neck: Neck supple.  Cardiovascular: Normal rate and regular rhythm.   Pulmonary/Chest: Effort normal and breath sounds normal. No respiratory distress. She has no wheezes. She has no rales.  Lymphadenopathy:    She has no cervical adenopathy.  Neurological: She is alert and oriented to person, place, and time. No cranial nerve deficit.          Assessment & Plan:  Patient seen with headache. She does have some left ethmoid and maxillary sinus pressure which suggests possible acute sinusitis. Suspect she also has migraine. She is reluctant to take anything other than over-the-counter medications for headache. Start Augmentin 875 mg twice daily for 10 days. Zofran for nausea. Follow-up with primary if headache persists

## 2015-02-22 NOTE — Progress Notes (Signed)
Pre visit review using our clinic review tool, if applicable. No additional management support is needed unless otherwise documented below in the visit note. 

## 2015-02-22 NOTE — Patient Instructions (Signed)

## 2015-02-27 ENCOUNTER — Telehealth: Payer: Self-pay

## 2015-02-27 MED ORDER — FLUCONAZOLE 150 MG PO TABS: 150.0000 mg | ORAL_TABLET | Freq: Once | ORAL | Status: DC

## 2015-02-27 NOTE — Telephone Encounter (Signed)
Pt left v/m; pt seen 02/22/15 and started on abx; now has yeast infection and pt request med sent to [facility] for yeast infection.Please advise. Pt request cb.

## 2015-02-27 NOTE — Telephone Encounter (Signed)
Rx sent in

## 2015-02-27 NOTE — Telephone Encounter (Signed)
Sharon Chaney notified prescription has been sent to her pharmacy as requested.

## 2015-04-27 ENCOUNTER — Emergency Department (HOSPITAL_COMMUNITY)
Admission: EM | Admit: 2015-04-27 | Discharge: 2015-04-27 | Disposition: A | Discharge status: Home / Self Care | Payer: PRIVATE HEALTH INSURANCE | Attending: Emergency Medicine | Admitting: Emergency Medicine

## 2015-04-27 ENCOUNTER — Encounter (HOSPITAL_COMMUNITY): Payer: Self-pay | Admitting: Emergency Medicine

## 2015-04-27 DIAGNOSIS — G8929 Other chronic pain: Secondary | ICD-10-CM | POA: Diagnosis not present

## 2015-04-27 DIAGNOSIS — Z8742 Personal history of other diseases of the female genital tract: Secondary | ICD-10-CM | POA: Diagnosis not present

## 2015-04-27 DIAGNOSIS — F419 Anxiety disorder, unspecified: Secondary | ICD-10-CM | POA: Diagnosis not present

## 2015-04-27 DIAGNOSIS — Z91040 Latex allergy status: Secondary | ICD-10-CM | POA: Insufficient documentation

## 2015-04-27 DIAGNOSIS — Z79899 Other long term (current) drug therapy: Secondary | ICD-10-CM | POA: Diagnosis not present

## 2015-04-27 DIAGNOSIS — Z8701 Personal history of pneumonia (recurrent): Secondary | ICD-10-CM | POA: Diagnosis not present

## 2015-04-27 DIAGNOSIS — M5442 Lumbago with sciatica, left side: Secondary | ICD-10-CM | POA: Diagnosis not present

## 2015-04-27 DIAGNOSIS — M545 Low back pain: Secondary | ICD-10-CM | POA: Diagnosis present

## 2015-04-27 HISTORY — DX: Pneumonia, unspecified organism: J18.9

## 2015-04-27 MED ORDER — KETOROLAC TROMETHAMINE 30 MG/ML IJ SOLN
30.0000 mg | Freq: Once | INTRAMUSCULAR | Status: AC
  Administered 2015-04-27: 30 mg via INTRAMUSCULAR
  Filled 2015-04-27: qty 1

## 2015-04-27 MED ORDER — ACETAMINOPHEN 500 MG PO TABS: 1000.0000 mg | ORAL_TABLET | Freq: Four times a day (QID) | ORAL | Status: AC | PRN

## 2015-04-27 MED ORDER — DIAZEPAM 5 MG PO TABS
5.0000 mg | ORAL_TABLET | Freq: Once | ORAL | Status: AC
  Administered 2015-04-27: 5 mg via ORAL
  Filled 2015-04-27: qty 1

## 2015-04-27 MED ORDER — DIAZEPAM 5 MG PO TABS: 5.0000 mg | ORAL_TABLET | Freq: Two times a day (BID) | ORAL | Status: DC | PRN

## 2015-04-27 MED ORDER — IBUPROFEN 800 MG PO TABS: 800.0000 mg | ORAL_TABLET | Freq: Three times a day (TID) | ORAL | Status: DC

## 2015-04-27 NOTE — ED Notes (Signed)
PA at bedside.

## 2015-04-27 NOTE — ED Notes (Signed)
Pt states low back started hurting yesterday, worse today, difficulty moving, pain going down left leg, hx of back pain, but nothing that pt has seen dr for.

## 2015-04-27 NOTE — ED Provider Notes (Signed)
CSN: [id number]     Arrival date & time 04/27/15  [id number] History   First MD Initiated Contact with Patient 04/27/15 0845     Chief Complaint  Patient presents with  . Back Pain   HPI  Ms. Mcdill is an 50 y.o. female with history of chronic back pain and endometriosis who presents to the ED for evaluation of low back pain. She states she has had low back pain "for a while now" but yesterday it progressively got significantly worse. She states it feels like it is particularly worse on the left side near her left buttock/hip. She states the pain is sharp and constant. She reports associated sharp shooting pain down her left leg with occasional tingling in there left leg as well. Denies new injury or trauma. Denies weakness. She states it is difficult to walk or move around 2/2 pain. She states she has tried OTC tylenol around the clock and heating pads with minimal relief. She states she generally tries to avoid NSAIDs. Denies fever, chills, bowel/bladder incontinence, saddle paresthesias, h/o IVDU. She has never seen a specialist or talked to a PCP about her back pain.   Past Medical History  Diagnosis Date  . Complication of anesthesia   . PONV (postoperative nausea and vomiting)   . Endometriosis   . Pneumonia    Past Surgical History  Procedure Laterality Date  . Cesarean section    . Laparotomy    . Appendectomy    . Tonsillectomy     Family History  Problem Relation Age of Onset  . Anesthesia problems Neg Hx   . Hypotension Neg Hx   . Malignant hyperthermia Neg Hx   . Colon cancer Father 25  . Heart disease Father   . Heart disease Paternal Grandmother   . Diabetes Paternal Grandmother   . Stroke Paternal Grandfather   . Arthritis Maternal Grandmother    Social History  Substance Use Topics  . Smoking status: Never Smoker   . Smokeless tobacco: Never Used  . Alcohol Use: No   OB History    Gravida Para Term Preterm AB TAB SAB Ectopic Multiple Living   2 2 2       2       Review of Systems  All other systems reviewed and are negative.     Allergies  Latex  Home Medications   Prior to Admission medications   Medication Sig Start Date End Date Taking? Authorizing Provider  acetaminophen (TYLENOL) 500 MG tablet Take 1,000 mg by mouth every 6 (six) hours as needed for mild pain.   Yes Historical Provider, MD  loratadine (CLARITIN) 10 MG tablet Take 10 mg by mouth daily.   Yes Historical Provider, MD  ondansetron (ZOFRAN ODT) 8 MG disintegrating tablet Take 1 tablet (8 mg total) by mouth every 8 (eight) hours as needed for nausea or vomiting. 02/22/15   Eulas Post, MD   BP 116/71 mmHg  Pulse 69  Temp(Src) 98.8 F (37.1 C) (Oral)  Resp 20  SpO2 100% Physical Exam  Constitutional: She is oriented to person, place, and time. No distress.  HENT:  Right Ear: External ear normal.  Left Ear: External ear normal.  Nose: Nose normal.  Mouth/Throat: Oropharynx is clear and moist. No oropharyngeal exudate.  Eyes: Conjunctivae and EOM are normal. Pupils are equal, round, and reactive to light.  Neck: Full passive range of motion without pain. Neck supple.  Cardiovascular: Normal rate, regular rhythm, normal heart sounds and intact  distal pulses.   Pulmonary/Chest: Effort normal and breath sounds normal. No respiratory distress.  Abdominal: Soft. Bowel sounds are normal. She exhibits no distension. There is no tenderness.  Musculoskeletal: Normal range of motion. She exhibits no edema.  No c-spine, t-spine, or l-spine tenderness. Diffuse left paraspinal muscle tenderness with spasm. No hip tenderness or leg tenderness. No gross deformity.   Neurological: She is alert and oriented to person, place, and time. She has normal strength. No cranial nerve deficit or sensory deficit. She exhibits normal muscle tone. She displays a negative Romberg sign. Coordination and gait normal.  Skin: Skin is warm and dry. She is not diaphoretic.  Psychiatric: Her mood  appears anxious.  Nursing note and vitals reviewed.   ED Course  Procedures (including critical care time) Labs Review Labs Reviewed - No data to display  Imaging Review No results found. I have personally reviewed and evaluated these images and lab results as part of my medical decision-making.   EKG Interpretation None      MDM   Final diagnoses:  Bilateral low back pain with left-sided sciatica    Suspect musculoskeletal pain and spasm with sciatica. Pt has history of chronic back pain with progressive worsening since yeseterday. No spinal tenderness. No focal neuro deficits. No red flags for cauda equina, epidural abscess, or other emergent pathology. No back imaging indicated at this time. I offered Toradol and discussed with it might help with the inflammation and she can go back to taking Tylenol at home. She is in agreement. Will also give 5mg  Valium for muscle spasm and give short course rx for home. Pt instructed to f/u with pcp but referral to ortho given as well.   Pt reports mild relief with toradol and valium. Offered tylenol here but pt declines. Offered short course of steroids but pt also declines. Will d/c home with rx for valium, ibuprofen, and tylenol. Pt instructed to f/u with PCP for ongoing evaluation and management of acute on chronic back pain. Will give contact info for ortho. ER return precautions given.    Anne Ng, PA-C 04/28/15 [place], MD 04/29/15 1006

## 2015-04-27 NOTE — Discharge Instructions (Signed)
Please follow up with your primary care provider within one week for further evaluation of your back pain. I also gave you the contact information to orthopedics who you may also consider for evaluation of your back pain. Return to the ER for new or worsening symptoms.

## 2015-04-28 ENCOUNTER — Encounter: Payer: Self-pay | Admitting: Family Medicine

## 2015-07-12 ENCOUNTER — Ambulatory Visit (INDEPENDENT_AMBULATORY_CARE_PROVIDER_SITE_OTHER): Payer: BLUE CROSS/BLUE SHIELD | Admitting: Adult Health

## 2015-07-12 ENCOUNTER — Encounter: Payer: Self-pay | Admitting: Adult Health

## 2015-07-12 VITALS — BP 120/84 | HR 97 | Temp 98.6°F | Resp 20 | Ht 64.0 in | Wt 181.8 lb

## 2015-07-12 DIAGNOSIS — J209 Acute bronchitis, unspecified: Secondary | ICD-10-CM | POA: Diagnosis not present

## 2015-07-12 MED ORDER — METHYLPREDNISOLONE 4 MG PO TBPK: ORAL_TABLET | ORAL | Status: DC

## 2015-07-12 NOTE — Progress Notes (Signed)
Pre visit review using our clinic review tool, if applicable. No additional management support is needed unless otherwise documented below in the visit note. 

## 2015-07-12 NOTE — Patient Instructions (Addendum)
It was great meeting you today!  Your exam is consistent with bronchitis. I have sent in a prescription for prednisone dose pack. Take this as directed.   Follow up with PCP if no improvement in the next 2-3 days.     Acute Bronchitis Bronchitis is inflammation of the airways that extend from the windpipe into the lungs (bronchi). The inflammation often causes mucus to develop. This leads to a cough, which is the most common symptom of bronchitis.  In acute bronchitis, the condition usually develops suddenly and goes away over time, usually in a couple weeks. Smoking, allergies, and asthma can make bronchitis worse. Repeated episodes of bronchitis may cause further lung problems.  CAUSES Acute bronchitis is most often caused by the same virus that causes a cold. The virus can spread from person to person (contagious) through coughing, sneezing, and touching contaminated objects. SIGNS AND SYMPTOMS   Cough.   Fever.   Coughing up mucus.   Body aches.   Chest congestion.   Chills.   Shortness of breath.   Sore throat.  DIAGNOSIS  Acute bronchitis is usually diagnosed through a physical exam. Your health care provider will also ask you questions about your medical history. Tests, such as chest X-rays, are sometimes done to rule out other conditions.  TREATMENT  Acute bronchitis usually goes away in a couple weeks. Oftentimes, no medical treatment is necessary. Medicines are sometimes given for relief of fever or cough. Antibiotic medicines are usually not needed but may be prescribed in certain situations. In some cases, an inhaler may be recommended to help reduce shortness of breath and control the cough. A cool mist vaporizer may also be used to help thin bronchial secretions and make it easier to clear the chest.  HOME CARE INSTRUCTIONS  Get plenty of rest.   Drink enough fluids to keep your urine clear or pale yellow (unless you have a medical condition that requires  fluid restriction). Increasing fluids may help thin your respiratory secretions (sputum) and reduce chest congestion, and it will prevent dehydration.   Take medicines only as directed by your health care provider.  If you were prescribed an antibiotic medicine, finish it all even if you start to feel better.  Avoid smoking and secondhand smoke. Exposure to cigarette smoke or irritating chemicals will make bronchitis worse. If you are a smoker, consider using nicotine gum or skin patches to help control withdrawal symptoms. Quitting smoking will help your lungs heal faster.   Reduce the chances of another bout of acute bronchitis by washing your hands frequently, avoiding people with cold symptoms, and trying not to touch your hands to your mouth, nose, or eyes.   Keep all follow-up visits as directed by your health care provider.  SEEK MEDICAL CARE IF: Your symptoms do not improve after 1 week of treatment.  SEEK IMMEDIATE MEDICAL CARE IF:  You develop an increased fever or chills.   You have chest pain.   You have severe shortness of breath.  You have bloody sputum.   You develop dehydration.  You faint or repeatedly feel like you are going to pass out.  You develop repeated vomiting.  You develop a severe headache. MAKE SURE YOU:   Understand these instructions.  Will watch your condition.  Will get help right away if you are not doing well or get worse.   This information is not intended to replace advice given to you by your health care provider. Make sure you discuss any  questions you have with your health care provider.   Document Released: 05/20/2004 Document Revised: 05/03/2014 Document Reviewed: 10/03/2012 Elsevier Interactive Patient Education [employer].

## 2015-07-12 NOTE — Progress Notes (Signed)
   Subjective:    Patient ID: Sharon Chaney, female    DOB: 1965-05-17, 50 y.o.   MRN: [id number]  Cough This is a new problem. The current episode started in the past 7 days. The problem has been gradually worsening. The problem occurs constantly. The cough is productive of sputum. Associated symptoms include ear congestion, headaches, nasal congestion, postnasal drip, a sore throat and shortness of breath. Pertinent negatives include no fever. Nothing aggravates the symptoms. Treatments tried: Tylenol and Claritin D. The treatment provided no relief. Her past medical history is significant for asthma.   Review of Systems  Constitutional: Positive for activity change and fatigue. Negative for fever.  HENT: Positive for postnasal drip and sore throat.   Respiratory: Positive for cough and shortness of breath.   Skin: Negative.   Neurological: Positive for headaches.  All other systems reviewed and are negative.      Objective:   Physical Exam  Constitutional: She is oriented to person, place, and time. She appears well-developed and well-nourished. No distress.  HENT:  Head: Normocephalic and atraumatic.  Right Ear: External ear normal.  Left Ear: External ear normal.  Nose: Nose normal.  Mouth/Throat: Oropharynx is clear and moist. No oropharyngeal exudate.  Trace fluid behind left ear  Eyes: Conjunctivae and EOM are normal. Pupils are equal, round, and reactive to light. Right eye exhibits no discharge. Left eye exhibits no discharge. No scleral icterus.  Neck: Normal range of motion. Neck supple.  Cardiovascular: Normal rate, regular rhythm, normal heart sounds and intact distal pulses.  Exam reveals no gallop and no friction rub.   No murmur heard. Pulmonary/Chest: Effort normal and breath sounds normal. No respiratory distress. She has no wheezes. She has no rales. She exhibits no tenderness.  Increased expiratory phase  Lymphadenopathy:    She has no cervical adenopathy.    Neurological: She is alert and oriented to person, place, and time.  Skin: Skin is warm and dry. No rash noted. She is not diaphoretic. No erythema. No pallor.  Psychiatric: She has a normal mood and affect. Her behavior is normal. Judgment and thought content normal.  Nursing note and vitals reviewed.     Assessment & Plan:   1. Acute bronchitis, unspecified organism - methylPREDNISolone (MEDROL DOSEPAK) 4 MG TBPK tablet; Take as directed  Dispense: 21 tablet; Refill: 0 - Follow up with PCP if no improvement

## 2015-07-14 ENCOUNTER — Telehealth: Payer: Self-pay | Admitting: Family Medicine

## 2015-07-14 NOTE — Telephone Encounter (Signed)
Patient Name: Sharon Chaney DOB: 06-Mar-1966 Initial Comment Caller states was at clinic Sat; dx w/bronchitis; couple of days before had lower abd pain w/ spotting; still having spotting; Nurse Assessment Nurse: Ronnald Ramp, RN, Miranda Date/Time (Eastern Time): 07/14/2015 9:58:44 AM Confirm and document reason for call. If symptomatic, describe symptoms. You must click the next button to save text entered. ---Caller states she has been having pain in her right side since last week and then Sat started having some vaginal spotting. Also has some discomfort when urinating. Has the patient traveled out of the country within the last 30 days? ---No Does the patient have any new or worsening symptoms? ---Yes Will a triage be completed? ---Yes Related visit to physician within the last 2 weeks? ---No Does the PT have any chronic conditions? (i.e. diabetes, asthma, etc.) ---Yes List chronic conditions. ---Allergies Is the patient pregnant or possibly pregnant? (Ask all females between the ages of 74-55) ---No Is this a behavioral health or substance abuse call? ---No Guidelines Guideline Title Affirmed Question Affirmed Notes Vaginal Bleeding - Abnormal [1] Constant abdominal pain AND [2] present > 2 hours Urination Pain - Female Side (flank) or lower back pain present Final Disposition User See Physician within 4 Hours (or PCP triage) Ronnald Ramp, RN, Miranda Comments Not time for her normal cycle. Caller states she has no transportation to go anywhere today. She would like an appt for tomorrow. Told caller that I would send a message to the provider to evaluate and ask someone to call her back. Referrals GO TO FACILITY REFUSED  Disagree/Comply: Disagree Disagree/Comply Reason: Unable to find transportation

## 2015-07-14 NOTE — Telephone Encounter (Signed)
Noted, agree with plan.

## 2015-07-14 NOTE — Telephone Encounter (Signed)
I spoke with pt and she cannot come in for appt 07/14/15; pt scheduled appt on 07/15/15 at 11:15 with R Baity NP; pt seen at [facility] 07/12/15; menstrual spotting started on 07/12/15; pt also having pain when urinate. LMP 06/27/15; pt had spotting between menstrual periods last year. Several years ago pt had cyst on ovary and pt feels similar symptoms now. Pt has pinching pain in rt side; pain level now is 5. No fever.If pt condition changes or worsens prior to appt 07/15/15 at 11:15 with Avie Echevaria NP pt will go to [facility] or ED.

## 2015-07-15 ENCOUNTER — Encounter: Payer: Self-pay | Admitting: Internal Medicine

## 2015-07-15 ENCOUNTER — Ambulatory Visit (INDEPENDENT_AMBULATORY_CARE_PROVIDER_SITE_OTHER): Payer: BLUE CROSS/BLUE SHIELD | Admitting: Internal Medicine

## 2015-07-15 VITALS — BP 116/82 | HR 92 | Temp 98.2°F | Wt 178.0 lb

## 2015-07-15 DIAGNOSIS — R102 Pelvic and perineal pain: Secondary | ICD-10-CM

## 2015-07-15 DIAGNOSIS — N939 Abnormal uterine and vaginal bleeding, unspecified: Secondary | ICD-10-CM

## 2015-07-15 DIAGNOSIS — N898 Other specified noninflammatory disorders of vagina: Secondary | ICD-10-CM

## 2015-07-15 NOTE — Progress Notes (Signed)
Subjective:    Patient ID: Sharon Chaney, female    DOB: 01/24/1966, 50 y.o.   MRN: [id number]  HPI  Pt presents to the clinic today with c/o right side pelvic pain, vaginal spotting and nausea. This started 4 days ago. She describes the pain as pinching. Her bowels are moving normally, she denies vomiting. The vaginal spotting was initially bright red, but has become darker and heavier. Her LMP was 06/27/15 and lasted for 7 days. Her periods come every month. She has not been sexually active in the last week. Her last pap smear was 1 year ago. She has also had a history of ovarian cysts, and reports this feels very similar.  Review of Systems      Past Medical History  Diagnosis Date  . Complication of anesthesia   . PONV (postoperative nausea and vomiting)   . Endometriosis   . Pneumonia     Current Outpatient Prescriptions  Medication Sig Dispense Refill  . acetaminophen (TYLENOL) 500 MG tablet Take 2 tablets (1,000 mg total) by mouth every 6 (six) hours as needed. 30 tablet 0  . ibuprofen (ADVIL,MOTRIN) 800 MG tablet Take 1 tablet (800 mg total) by mouth 3 (three) times daily. 21 tablet 0  . loratadine (CLARITIN) 10 MG tablet Take 10 mg by mouth daily.    . methylPREDNISolone (MEDROL DOSEPAK) 4 MG TBPK tablet Take as directed (Patient not taking: Reported on 07/15/2015) 21 tablet 0   No current facility-administered medications for this visit.    Allergies  Allergen Reactions  . Latex Rash    Family History  Problem Relation Age of Onset  . Anesthesia problems Neg Hx   . Hypotension Neg Hx   . Malignant hyperthermia Neg Hx   . Colon cancer Father 63  . Heart disease Father   . Heart disease Paternal Grandmother   . Diabetes Paternal Grandmother   . Stroke Paternal Grandfather   . Arthritis Maternal Grandmother     Social History   Social History  . Marital Status: Married    Spouse Name: N/A  . Number of Children: N/A  . Years of Education: N/A    Occupational History  . Not on file.   Social History Main Topics  . Smoking status: Never Smoker   . Smokeless tobacco: Never Used  . Alcohol Use: No  . Drug Use: No  . Sexual Activity: Yes   Other Topics Concern  . Not on file   Social History Narrative     Constitutional: Denies fever, malaise, fatigue, headache or abrupt weight changes.  HEENT: Denies eye pain, eye redness, ear pain, ringing in the ears, wax buildup, runny nose, nasal congestion, bloody nose, or sore throat. Respiratory: Denies difficulty breathing, shortness of breath, cough or sputum production.   Cardiovascular: Denies chest pain, chest tightness, palpitations or swelling in the hands or feet.  Gastrointestinal: Pt reports right pelvic pain. Denies bloating, constipation, diarrhea or blood in the stool.  GU: Pt reports vaginal bleeding. Denies urgency, frequency, pain with urination, burning sensation, blood in urine, odor or discharge.   No other specific complaints in a complete review of systems (except as listed in HPI above).  Objective:   Physical Exam   BP 116/82 mmHg  Pulse 92  Temp(Src) 98.2 F (36.8 C) (Oral)  Wt 178 lb (80.74 kg)  SpO2 99%  LMP 06/27/2015 Wt Readings from Last 3 Encounters:  07/15/15 178 lb (80.74 kg)  07/12/15 181 lb 12 oz (82.441  kg)  02/22/15 180 lb (81.647 kg)    General: Appears her stated age, in NAD. HEENT: Head: normal shape and size, no sinus tenderness noted; Eyes: sclera white, no icterus, conjunctiva pink; Ears: Tm's pink but intact, normal light reflex; Throat/Mouth: Teeth present, mucosa pink and moist, no exudate, lesions or ulcerations noted.  Neck:  No adenopathy noted. Cardiovascular: Normal rate and rhythm. S1,S2 noted.  No murmur, rubs or gallops noted.  Pulmonary/Chest: Normal effort and positive vesicular breath sounds. No respiratory distress. No wheezes, rales or ronchi noted.  Abdomen: Soft and tender in the RLQ, suprapubic area. Normal  bowel sounds. No CVA tenderness noted No distention or masses noted.  Pelvic: Normal female anatomy. Brown/burgandy discharge noted in the vaginal vault. No CMT, adnexa non palpable.  BMET    Component Value Date/Time   NA 136 08/01/2014 1130   K 4.4 08/01/2014 1130   CL 105 08/01/2014 1130   CO2 25 08/01/2014 1130   GLUCOSE 100* 08/01/2014 1130   BUN 11 08/01/2014 1130   CREATININE 1.03 08/01/2014 1130   CALCIUM 9.7 08/01/2014 1130   GFRNONAA 84 03/15/2008 1012   GFRAA 102 03/15/2008 1012    Lipid Panel     Component Value Date/Time   CHOL 220* 08/01/2014 1130   TRIG 114.0 08/01/2014 1130   HDL 44.70 08/01/2014 1130   CHOLHDL 5 08/01/2014 1130   VLDL 22.8 08/01/2014 1130   LDLCALC 153* 08/01/2014 1130    CBC    Component Value Date/Time   WBC 8.7 08/01/2014 1130   RBC 4.94 08/01/2014 1130   HGB 14.7 08/01/2014 1130   HCT 43.9 08/01/2014 1130   PLT 308.0 08/01/2014 1130   MCV 88.9 08/01/2014 1130   MCHC 33.5 08/01/2014 1130   RDW 15.0 08/01/2014 1130   LYMPHSABS 2.2 08/01/2014 1130   MONOABS 0.5 08/01/2014 1130   EOSABS 0.1 08/01/2014 1130   BASOSABS 0.0 08/01/2014 1130    Hgb A1C No results found for: HGBA1C      Assessment & Plan:   Pelvic pain and vaginal spotting:  Urinalysis: + blood, no leuks No need to repeat pap No concern for STD Advised her to try Ibuprofen prn If pain persist or worsen, could get pelvic/tansvaginal ultrasound   RTC as needed or if symptoms persist or worsen

## 2015-07-15 NOTE — Patient Instructions (Signed)

## 2015-07-15 NOTE — Progress Notes (Signed)
Pre visit review using our clinic review tool, if applicable. No additional management support is needed unless otherwise documented below in the visit note. 

## 2015-07-23 LAB — POC URINALSYSI DIPSTICK (AUTOMATED)
Glucose, UA: NEGATIVE
Leukocytes, UA: NEGATIVE
Nitrite, UA: NEGATIVE
PH UA: 6
Urobilinogen, UA: 0.2

## 2015-07-23 NOTE — Addendum Note (Signed)
Addended by: Lurlean Nanny on: 07/23/2015 10:41 AM   Modules accepted: Orders

## 2015-08-05 ENCOUNTER — Other Ambulatory Visit: Payer: PRIVATE HEALTH INSURANCE

## 2015-08-08 ENCOUNTER — Encounter: Payer: PRIVATE HEALTH INSURANCE | Admitting: Family Medicine

## 2015-08-27 ENCOUNTER — Encounter: Payer: Self-pay | Admitting: Family Medicine

## 2015-08-27 ENCOUNTER — Ambulatory Visit (INDEPENDENT_AMBULATORY_CARE_PROVIDER_SITE_OTHER): Payer: BLUE CROSS/BLUE SHIELD | Admitting: Family Medicine

## 2015-08-27 ENCOUNTER — Telehealth: Payer: Self-pay | Admitting: Family Medicine

## 2015-08-27 VITALS — BP 112/80 | HR 90 | Ht 64.0 in | Wt 180.4 lb

## 2015-08-27 DIAGNOSIS — G43109 Migraine with aura, not intractable, without status migrainosus: Secondary | ICD-10-CM

## 2015-08-27 DIAGNOSIS — Z0001 Encounter for general adult medical examination with abnormal findings: Secondary | ICD-10-CM

## 2015-08-27 DIAGNOSIS — Z13220 Encounter for screening for lipoid disorders: Secondary | ICD-10-CM | POA: Diagnosis not present

## 2015-08-27 DIAGNOSIS — Z23 Encounter for immunization: Secondary | ICD-10-CM | POA: Diagnosis not present

## 2015-08-27 DIAGNOSIS — R42 Dizziness and giddiness: Secondary | ICD-10-CM | POA: Insufficient documentation

## 2015-08-27 DIAGNOSIS — N644 Mastodynia: Secondary | ICD-10-CM | POA: Diagnosis not present

## 2015-08-27 DIAGNOSIS — Z0000 Encounter for general adult medical examination without abnormal findings: Secondary | ICD-10-CM

## 2015-08-27 DIAGNOSIS — R5382 Chronic fatigue, unspecified: Secondary | ICD-10-CM

## 2015-08-27 DIAGNOSIS — R5383 Other fatigue: Secondary | ICD-10-CM | POA: Insufficient documentation

## 2015-08-27 DIAGNOSIS — R0789 Other chest pain: Secondary | ICD-10-CM | POA: Insufficient documentation

## 2015-08-27 LAB — COMPREHENSIVE METABOLIC PANEL
ALBUMIN: 4.4 g/dL (ref 3.5–5.2)
ALK PHOS: 39 U/L (ref 39–117)
ALT: 17 U/L (ref 0–35)
AST: 17 U/L (ref 0–37)
BILIRUBIN TOTAL: 0.5 mg/dL (ref 0.2–1.2)
BUN: 14 mg/dL (ref 6–23)
CALCIUM: 9.5 mg/dL (ref 8.4–10.5)
CO2: 28 mEq/L (ref 19–32)
CREATININE: 0.84 mg/dL (ref 0.40–1.20)
Chloride: 104 mEq/L (ref 96–112)
GFR: 76.51 mL/min (ref >60.00)
Glucose, Bld: 100 mg/dL — ABNORMAL HIGH (ref 70–99)
Potassium: 4 mEq/L (ref 3.5–5.1)
SODIUM: 138 meq/L (ref 135–145)
Total Protein: 6.9 g/dL (ref 6.0–8.3)

## 2015-08-27 LAB — CBC WITH DIFFERENTIAL/PLATELET
Basophils Absolute: 0 10*3/uL (ref 0.0–0.1)
Basophils Relative: 0.4 % (ref 0.0–3.0)
EOS ABS: 0.1 10*3/uL (ref 0.0–0.7)
Eosinophils Relative: 0.9 % (ref 0.0–5.0)
HCT: 42.8 % (ref 36.0–46.0)
HEMOGLOBIN: 14.2 g/dL (ref 12.0–15.0)
LYMPHS ABS: 1.8 10*3/uL (ref 0.7–4.0)
LYMPHS PCT: 25.8 % (ref 12.0–46.0)
MCHC: 33.1 g/dL (ref 30.0–36.0)
MCV: 90.5 fl (ref 78.0–100.0)
Monocytes Absolute: 0.5 10*3/uL (ref 0.1–1.0)
Monocytes Relative: 6.8 % (ref 3.0–12.0)
NEUTROS PCT: 66.1 % (ref 43.0–77.0)
Neutro Abs: 4.7 10*3/uL (ref 1.4–7.7)
Platelets: 311 10*3/uL (ref 150.0–400.0)
RBC: 4.72 Mil/uL (ref 3.87–5.11)
RDW: 15.1 % (ref 11.5–15.5)
WBC: 7 10*3/uL (ref 4.0–10.5)

## 2015-08-27 LAB — LIPID PANEL
CHOLESTEROL: 224 mg/dL — AB (ref 0–200)
HDL: 46.3 mg/dL (ref >39.00)
LDL CALC: 155 mg/dL — AB (ref 0–99)
NonHDL: 177.36
TRIGLYCERIDES: 113 mg/dL (ref 0.0–149.0)
Total CHOL/HDL Ratio: 5
VLDL: 22.6 mg/dL (ref 0.0–40.0)

## 2015-08-27 LAB — VITAMIN D 25 HYDROXY (VIT D DEFICIENCY, FRACTURES): VITD: 19.1 ng/mL — AB (ref 30.00–100.00)

## 2015-08-27 LAB — T4, FREE: FREE T4: 0.72 ng/dL (ref 0.60–1.60)

## 2015-08-27 LAB — T3, FREE: T3, Free: 2.7 pg/mL (ref 2.3–4.2)

## 2015-08-27 LAB — VITAMIN B12: Vitamin B-12: 225 pg/mL (ref 211–911)

## 2015-08-27 LAB — TSH: TSH: 1.01 u[IU]/mL (ref 0.35–4.50)

## 2015-08-27 NOTE — Telephone Encounter (Signed)
i called [facility] to make appointment Orders need to be changed to  mammogram [id number] [facility] [phone] [id number] Thanks Thanks

## 2015-08-27 NOTE — Assessment & Plan Note (Signed)
EKG nml.. No clear sign of cardiac etiology.  Most likely MSK pain versus referred from migraine.

## 2015-08-27 NOTE — Assessment & Plan Note (Signed)
Nml neuro exam. n red flags.  Cssilac migraine. At next OV consider starting prophylactic medication like topamax given frequency 4 per month.

## 2015-08-27 NOTE — Assessment & Plan Note (Signed)
Eval with labs. 

## 2015-08-27 NOTE — Progress Notes (Signed)
50 year old female presents for annual wellness exam.  History of migraine She has been having worsening headaches in last  Year. Pain in temples of  head Last week had headache in left temple, woke up with it, radiated into into left arm, left chest and left neck.  Nausea and photophonophobia presents.  Notes aura prior to headache coming on.  Pain has continued since x 4 days, better now. Made her not able to sleep.  Using ibuprofen and tylenol. Having headaches 4 times a month, typically for a few days.  Occ pins and needles on legs. occ chest pain with menses. PAin in right leg over varicose vein in right leg. Chest pain occ with exertion.  Exercise:none  Diet: fruit and veggies  History  Substance Use Topics  . Smoking status: Never Smoker   . Smokeless tobacco: Never Used  . Alcohol Use: No   family history includes Arthritis in her maternal grandmother; Colon cancer (age of onset: 63) in her father; Diabetes in her paternal grandmother; Heart disease in her father and paternal grandmother; Stroke in her paternal grandfather. There is no history of Anesthesia problems, Hypotension, or Malignant hyperthermia.  Review of Systems  Constitutional: Negative for fever, fatigue and unexpected weight change.  HENT: Negative for congestion, ear pain, sinus pressure, sneezing, sore throat and trouble swallowing.  Eyes: Negative for pain and itching.  Respiratory: Negative for cough, shortness of breath and wheezing.  Cardiovascular: Negative for chest pain, palpitations and leg swelling.  Gastrointestinal: Negative for nausea, abdominal pain, diarrhea, constipation and blood in stool.  Genitourinary: Negative for dysuria, hematuria, vaginal discharge, difficulty urinating and menstrual problem.  Skin: Negative for rash.  Neurological: Positive for dizziness. Negative for syncope, weakness, light-headedness, numbness and headaches.  Psychiatric/Behavioral: Positive  for sleep disturbance. Negative for confusion and dysphoric mood. The patient is not nervous/anxious.   Some increase in stress       Objective:   Physical Exam  Constitutional: Vital signs are normal. She appears well-developed and well-nourished. She is cooperative. Non-toxic appearance. She does not appear ill. No distress.  HENT:  Head: Normocephalic.  Right Ear: Hearing, tympanic membrane, external ear and ear canal normal.  Left Ear: Hearing, tympanic membrane, external ear and ear canal normal.  Nose: Nose normal.  Eyes: Conjunctivae, EOM and lids are normal. Pupils are equal, round, and reactive to light. Lids are everted and swept, no foreign bodies found.  Neck: Trachea normal and normal range of motion. Neck supple. Carotid bruit is not present. No thyroid mass and no thyromegaly present.  Cardiovascular: Normal rate, regular rhythm, S1 normal, S2 normal, normal heart sounds and intact distal pulses. Exam reveals no gallop.  No murmur heard. Pulmonary/Chest: Effort normal and breath sounds normal. No respiratory distress. She has no wheezes. She has no rhonchi. She has no rales.  Abdominal: Soft. Normal appearance and bowel sounds are normal. She exhibits no distension, no fluid wave, no abdominal bruit and no mass. There is no hepatosplenomegaly. There is no tenderness. There is no rebound, no guarding and no CVA tenderness. No hernia.  Genitourinary: No breast swelling, tenderness in left outer breast, nonfocal, discharge or bleeding. Pelvic exam was performed with patient supine.  Pap not indicated, nml vaginal exam, no masses.  Lymphadenopathy:   She has no cervical adenopathy.   She has no axillary adenopathy.  Neurological: She is alert. She has normal strength. No cranial nerve deficit or sensory deficit.  Skin: Skin is warm, dry and intact.  No rash noted.  Psychiatric: Her speech is normal and behavior is normal. Judgment normal. Her mood appears not  anxious. Cognition and memory are normal. She does not exhibit a depressed mood.          Assessment & Plan:  The patient's preventative maintenance and recommended screening tests for an annual wellness exam were reviewed in full today. Brought up to date unless services declined.  Counselled on the importance of diet, exercise, and its role in overall health and mortality. The patient's FH and SH was reviewed, including their home life, tobacco status, and drug and alcohol status.    Vaccines: Due for Tdap,  Mammo: Nml 2016, due HIV: tested in past Colon: Fam hx. Not due until age 74      Nonsmoker PAP, neg HPV nml 2016

## 2015-08-27 NOTE — Assessment & Plan Note (Signed)
Diagnostic mammo and [place].

## 2015-08-27 NOTE — Patient Instructions (Addendum)
Stop at front desk to set up diagnostic mammogram given pain in left breast.  Stop at lab on way out.  Wear compression hose to help with varicose vein pain in right leg. Work on exercise and weight loss.

## 2015-08-27 NOTE — Progress Notes (Signed)
Pre visit review using our clinic review tool, if applicable. No additional management support is needed unless otherwise documented below in the visit note. 

## 2015-08-28 ENCOUNTER — Ambulatory Visit
Admission: RE | Admit: 2015-08-28 | Discharge: 2015-08-28 | Disposition: A | Discharge status: Home / Self Care | Payer: BLUE CROSS/BLUE SHIELD | Source: Ambulatory Visit | Attending: Family Medicine | Admitting: Family Medicine

## 2015-08-28 DIAGNOSIS — N644 Mastodynia: Secondary | ICD-10-CM

## 2015-08-28 MED ORDER — VITAMIN D (ERGOCALCIFEROL) 1.25 MG (50000 UNIT) PO CAPS: 50000.0000 [IU] | ORAL_CAPSULE | ORAL | Status: DC

## 2015-08-28 NOTE — Addendum Note (Signed)
Addended by: Carter Kitten on: 08/28/2015 04:49 PM   Modules accepted: Orders

## 2015-09-03 ENCOUNTER — Encounter: Payer: Self-pay | Admitting: Family Medicine

## 2015-09-04 MED ORDER — OMEPRAZOLE 20 MG PO CPDR: 20.0000 mg | DELAYED_RELEASE_CAPSULE | Freq: Every day | ORAL | Status: DC

## 2016-03-25 ENCOUNTER — Telehealth: Payer: Self-pay | Admitting: Family Medicine

## 2016-03-25 NOTE — Telephone Encounter (Signed)
Agreed -

## 2016-03-25 NOTE — Telephone Encounter (Signed)
Please see team health note dated 03/25/16; I spoke with pt.Pt said she is not having CP but some difficulty breathing; pt has a lot of issues with breathing routinely. Pt said she has been exposed to the flu over Thanksgiving, achy, chilling, h/a and feeling tired.pt does not think she is running fever and no real cough; pt does not want to go to UC or ED. Pt scheduled appt with Dr Diona Browner on 03/26/16 at 11:00; if pt condition worsens prior to appt pt will go to UC.

## 2016-03-25 NOTE — Telephone Encounter (Signed)
Pt said she is not having CP but some difficulty breathing; pt has a lot of issues with breathing routinely. Pt said she has been exposed to the flu over Thanksgiving, achy, chilling, h/a and feeling tired.pt does not think she is running fever and no real cough; pt does not want to go to UC or ED. Pt scheduled appt with Dr Diona Browner on 03/26/16 at 11:00; if pt condition worsens prior to appt pt will go to UC.

## 2016-03-25 NOTE — Telephone Encounter (Signed)
Patient Name: Sharon Chaney DOB: 1965/07/16 Initial Comment Caller states, she has been exposed ot the flu and what really bothers her is her chest and she is having difficulty breathing Nurse Assessment Nurse: Andria Frames, RN, Aeriel Date/Time (Eastern Time): 03/25/2016 12:09:18 PM Confirm and document reason for call. If symptomatic, describe symptoms. ---Caller states, she has been exposed to the flu and what really bothers her is her chest and she is having difficulty breathing. She had H1N1 and she feels like she did then. She is exhausted and feels horrible. Does the patient have any new or worsening symptoms? ---Yes Will a triage be completed? ---Yes Related visit to physician within the last 2 weeks? ---No Does the PT have any chronic conditions? (i.e. diabetes, asthma, etc.) ---No Is the patient pregnant or possibly pregnant? (Ask all females between the ages of 45-55) ---No Is this a behavioral health or substance abuse call? ---No Guidelines Guideline Title Affirmed Question Affirmed Notes Influenza - Seasonal Chest pain (Exception: MILD central chest pain, present only when coughing) Final Disposition User Go to ED Now Hensel, RN, Aeriel Comments Caller has been having chest pain. Caller currently denies any chest pain. Caller refused outcome. Caller would like an appt instead. Referrals GO TO FACILITY REFUSED Disagree/Comply: Disagree Disagree/Comply Reason: Disagree with instructions

## 2016-03-25 NOTE — Telephone Encounter (Signed)
Please call to get more info.. Have her make appt in our office if appropriate and availability.

## 2016-03-25 NOTE — Telephone Encounter (Signed)
Team health called - pt refused ed outcome - she is having chest pressures, sob, was exposed to flu,  Team health also sending over report  Thanks

## 2016-03-26 ENCOUNTER — Ambulatory Visit (INDEPENDENT_AMBULATORY_CARE_PROVIDER_SITE_OTHER): Payer: BLUE CROSS/BLUE SHIELD | Admitting: Family Medicine

## 2016-03-26 ENCOUNTER — Telehealth: Payer: Self-pay | Admitting: *Deleted

## 2016-03-26 ENCOUNTER — Encounter: Payer: Self-pay | Admitting: Family Medicine

## 2016-03-26 VITALS — BP 118/76 | HR 103 | Temp 99.1°F | Ht 64.0 in | Wt 187.5 lb

## 2016-03-26 DIAGNOSIS — R52 Pain, unspecified: Secondary | ICD-10-CM | POA: Diagnosis not present

## 2016-03-26 DIAGNOSIS — B9789 Other viral agents as the cause of diseases classified elsewhere: Secondary | ICD-10-CM

## 2016-03-26 DIAGNOSIS — J069 Acute upper respiratory infection, unspecified: Secondary | ICD-10-CM | POA: Diagnosis not present

## 2016-03-26 DIAGNOSIS — G43109 Migraine with aura, not intractable, without status migrainosus: Secondary | ICD-10-CM

## 2016-03-26 LAB — POC INFLUENZA A&B (BINAX/QUICKVUE)
INFLUENZA A, POC: NEGATIVE
Influenza B, POC: NEGATIVE

## 2016-03-26 MED ORDER — SUMATRIPTAN SUCCINATE 100 MG PO TABS: 100.0000 mg | ORAL_TABLET | ORAL | 0 refills | Status: DC | PRN

## 2016-03-26 NOTE — Progress Notes (Signed)
   Subjective:    Patient ID: Sharon Chaney, female    DOB: 1965/05/28, 50 y.o.   MRN: [id number]  HPI  50 year old female presents with sudden onset Shortness of breath, headache, body aches, fatigue 2 days ago.   Rapidly worsening.  Fever low grade here, chills. She had been having proceeding congestion in last week. No ear pain, left facial pain. Has mild sore throat. Left sided headache.Marland Kitchen Hx of migraine.  She feels like she cannot get a deep breath, not breathing fast. No cough.  She has been using tylenol and OTC cold med for relief.   Exposed to flu 1 week ago.   Review of Systems  Constitutional: Positive for fatigue. Negative for fever.  HENT: Negative for ear pain.   Eyes: Negative for pain.  Respiratory: Positive for shortness of breath. Negative for cough, chest tightness, wheezing and stridor.   Cardiovascular: Negative for chest pain, palpitations and leg swelling.  Gastrointestinal: Negative for abdominal pain.  Genitourinary: Negative for dysuria.       Objective:   Physical Exam  Constitutional: Vital signs are normal. She appears well-developed and well-nourished. She is cooperative.  Non-toxic appearance. She does not appear ill. No distress.  Fatigued appearing in NAD  HENT:  Head: Normocephalic.  Right Ear: Hearing, tympanic membrane, external ear and ear canal normal. Tympanic membrane is not erythematous, not retracted and not bulging.  Left Ear: Hearing, tympanic membrane, external ear and ear canal normal. Tympanic membrane is not erythematous, not retracted and not bulging.  Nose: Rhinorrhea present. No mucosal edema. Right sinus exhibits no maxillary sinus tenderness and no frontal sinus tenderness. Left sinus exhibits no maxillary sinus tenderness and no frontal sinus tenderness.  Mouth/Throat: Uvula is midline, oropharynx is clear and moist and mucous membranes are normal.  Eyes: Conjunctivae, EOM and lids are normal. Pupils are equal, round,  and reactive to light. Lids are everted and swept, no foreign bodies found.  Neck: Trachea normal and normal range of motion. Neck supple. Carotid bruit is not present. No thyroid mass and no thyromegaly present.  Cardiovascular: Normal rate, regular rhythm, S1 normal, S2 normal, normal heart sounds, intact distal pulses and normal pulses.  Exam reveals no gallop and no friction rub.   No murmur heard. Pulmonary/Chest: Effort normal and breath sounds normal. No tachypnea. No respiratory distress. She has no decreased breath sounds. She has no wheezes. She has no rhonchi. She has no rales.  Neurological: She is alert.  Skin: Skin is warm, dry and intact. No rash noted.  Psychiatric: Her speech is normal and behavior is normal. Judgment normal. Her mood appears not anxious. Cognition and memory are normal. She does not exhibit a depressed mood.          Assessment & Plan:

## 2016-03-26 NOTE — Patient Instructions (Addendum)
Rest, fluids.  Expect 7-10 days of illness.  Call if shortness of breath worsening.  Can use imitrex for acute migraine if  Ibuprofen not helping.

## 2016-03-26 NOTE — Addendum Note (Signed)
Addended by: Carter Kitten on: 03/26/2016 11:44 AM   Modules accepted: Orders

## 2016-03-26 NOTE — Assessment & Plan Note (Signed)
Can use imitrex prn migraine severe if NSAID not helping.  Follow up for re-eval if > 1-2 migraines per month.

## 2016-03-26 NOTE — Progress Notes (Signed)
Pre visit review using our clinic review tool, if applicable. No additional management support is needed unless otherwise documented below in the visit note. 

## 2016-03-26 NOTE — Assessment & Plan Note (Signed)
Neg flu test.. Likely other viral syndrome. Symtpomatic care. Rest, fluids.

## 2016-03-26 NOTE — Telephone Encounter (Signed)
[place] from [facility] called to let us know that the Imitrex 100 mg are on backorder.  She states they do have the 50 mg.  Verbal order given to change to Imitrex 50 mg, take two tablets by mouth every 2 hours prn for Migraine. Quantity of 20 given with no refills.   Medication list updated.

## 2016-05-13 ENCOUNTER — Encounter: Payer: Self-pay | Admitting: Family Medicine

## 2016-05-18 ENCOUNTER — Ambulatory Visit (INDEPENDENT_AMBULATORY_CARE_PROVIDER_SITE_OTHER): Payer: BLUE CROSS/BLUE SHIELD | Admitting: Family Medicine

## 2016-05-18 ENCOUNTER — Encounter: Payer: Self-pay | Admitting: Family Medicine

## 2016-05-18 VITALS — BP 142/84 | HR 83 | Temp 98.5°F | Ht 64.0 in | Wt 188.0 lb

## 2016-05-18 DIAGNOSIS — N938 Other specified abnormal uterine and vaginal bleeding: Secondary | ICD-10-CM | POA: Diagnosis not present

## 2016-05-18 DIAGNOSIS — N6002 Solitary cyst of left breast: Secondary | ICD-10-CM

## 2016-05-18 DIAGNOSIS — R0789 Other chest pain: Secondary | ICD-10-CM

## 2016-05-18 LAB — CBC WITH DIFFERENTIAL/PLATELET
BASOS ABS: 0 10*3/uL (ref 0.0–0.1)
Basophils Relative: 0.5 % (ref 0.0–3.0)
Eosinophils Absolute: 0.1 10*3/uL (ref 0.0–0.7)
Eosinophils Relative: 1.8 % (ref 0.0–5.0)
HCT: 40.2 % (ref 36.0–46.0)
Hemoglobin: 13.5 g/dL (ref 12.0–15.0)
LYMPHS ABS: 2.6 10*3/uL (ref 0.7–4.0)
Lymphocytes Relative: 38.7 % (ref 12.0–46.0)
MCHC: 33.7 g/dL (ref 30.0–36.0)
MCV: 85.8 fl (ref 78.0–100.0)
MONO ABS: 0.5 10*3/uL (ref 0.1–1.0)
MONOS PCT: 7.6 % (ref 3.0–12.0)
NEUTROS PCT: 51.4 % (ref 43.0–77.0)
Neutro Abs: 3.4 10*3/uL (ref 1.4–7.7)
Platelets: 322 10*3/uL (ref 150.0–400.0)
RBC: 4.68 Mil/uL (ref 3.87–5.11)
RDW: 15.1 % (ref 11.5–15.5)
WBC: 6.7 10*3/uL (ref 4.0–10.5)

## 2016-05-18 LAB — LUTEINIZING HORMONE: LH: 33.25 m[IU]/mL

## 2016-05-18 LAB — FOLLICLE STIMULATING HORMONE: FSH: 45.6 m[IU]/mL

## 2016-05-18 NOTE — Progress Notes (Signed)
Pre visit review using our clinic review tool, if applicable. No additional management support is needed unless otherwise documented below in the visit note. 

## 2016-05-18 NOTE — Progress Notes (Signed)
Subjective:    Patient ID: Sharon Chaney, female    DOB: 11/11/1965, 51 y.o.   MRN: [id number]  HPI   51 year old female presents with  worsening chest pain, atypical.  Since last eval in 08/2015.Marland Kitchen  She has had chronic chest pain ( up through left jaw and down left arm, shoulder) and shortness of breath, heavy feeling in chest with menses PMS . Now is worse as it is always there chronically. Day and night.  Occ feeling like heart rate irregular  Off and on for several years.. More frequent. Pain waking her up at night.  She is tired overall. Activity makes her tired more easily... In last few months.  Ibuprofen 800 mg every eight hours as well as tylenol has not been helping. Slight worsening chest pain with activity.  No cough, no congestion. No wheeze.  Always has had heavy menses.. Last long time. Clots at times She is having continual spotting, ongoing since menses 1/6. Last month menses lasted 14 days.  hx of endometriosis  She does have soreness  Deep to left breast as well.  Mammo in 08/2015.Marland Kitchen Showed cyst under marker.. Decreased in size to 2014 mass.. Suggesting benign cyst.   Currently with heartburn but this feels different.  She does clench her teeth at night. 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 Jaw hurts worse with menses.  In 08/2015.. Cbc nml,  CMET nml,  Low b12, low vit d.. Repleted, nml thyroid, nml ekg   She does feel anxious more often. ? If symptoms are wirse with anxiety.  Review of Systems  Constitutional: Positive for fatigue. Negative for fever.  HENT: Negative for ear pain.   Eyes: Negative for pain.  Respiratory: Positive for chest tightness and shortness of breath.   Cardiovascular: Positive for chest pain and palpitations. Negative for leg swelling.  Gastrointestinal: Negative for abdominal pain.  Genitourinary: Negative for dysuria.       Objective:   Physical Exam  Constitutional: Vital signs are normal. She appears well-developed and well-nourished.  She is cooperative.  Non-toxic appearance. She does not appear ill. No distress.  HENT:  Head: Normocephalic.  Right Ear: Hearing, tympanic membrane, external ear and ear canal normal. Tympanic membrane is not erythematous, not retracted and not bulging.  Left Ear: Hearing, tympanic membrane, external ear and ear canal normal. Tympanic membrane is not erythematous, not retracted and not bulging.  Nose: No mucosal edema or rhinorrhea. Right sinus exhibits no maxillary sinus tenderness and no frontal sinus tenderness. Left sinus exhibits no maxillary sinus tenderness and no frontal sinus tenderness.  Mouth/Throat: Uvula is midline, oropharynx is clear and moist and mucous membranes are normal.  Eyes: Conjunctivae, EOM and lids are normal. Pupils are equal, round, and reactive to light. Lids are everted and swept, no foreign bodies found.  Neck: Trachea normal and normal range of motion. Neck supple. Carotid bruit is not present. No thyroid mass and no thyromegaly present.  Cardiovascular: Normal rate, regular rhythm, S1 normal, S2 normal, normal heart sounds, intact distal pulses and normal pulses.  Exam reveals no gallop and no friction rub.   No murmur heard. Pulmonary/Chest: Effort normal and breath sounds normal. No tachypnea. No respiratory distress. She has no decreased breath sounds. She has no wheezes. She has no rhonchi. She has no rales. She exhibits tenderness and bony tenderness. She exhibits no mass.    Left chest wall ttp, no cyst palpated in breast  Abdominal: Soft. Normal appearance and bowel sounds  are normal. There is generalized tenderness.  Neurological: She is alert.  Skin: Skin is warm, dry and intact. No rash noted.  Psychiatric: Her speech is normal and behavior is normal. Judgment and thought content normal. Her mood appears not anxious. Cognition and memory are normal. She does not exhibit a depressed mood.          Assessment & Plan:  Atypical chest pain, worse with  menses, but now constant ? Due to left breast cyst and chest wall pain  Given change with menses. Stop ibuprofen as not helping.  In light of palpitations and shortness of breath.. Will send for treadmill stress test despite nml EKG. Also for pt reassurance.   Irregular menses.Marland Kitchen Heavy and spotting: Start with eval of FSH and LH as well as cbc. Likely will need [place]  To eval for other causes of heavy bleeding. May need referral to GYN.  Fatigue: past lab eval showed B12 and vit D def.. Pt on supplement.   Left jaw pain: most likely due to TMJ.

## 2016-05-18 NOTE — Patient Instructions (Addendum)
Stop at lab on way out.  Stop at front desk for treadmill stress test.

## 2016-05-19 ENCOUNTER — Telehealth (HOSPITAL_COMMUNITY): Payer: Self-pay

## 2016-05-19 NOTE — Telephone Encounter (Signed)
Encounter complete. 

## 2016-05-20 ENCOUNTER — Telehealth (HOSPITAL_COMMUNITY): Payer: Self-pay

## 2016-05-20 NOTE — Telephone Encounter (Signed)
Encounter complete. 

## 2016-05-21 ENCOUNTER — Ambulatory Visit (HOSPITAL_COMMUNITY)
Admission: RE | Admit: 2016-05-21 | Discharge: 2016-05-21 | Disposition: A | Discharge status: Home / Self Care | Payer: BLUE CROSS/BLUE SHIELD | Source: Ambulatory Visit | Attending: Family Medicine | Admitting: Family Medicine

## 2016-05-21 DIAGNOSIS — R0789 Other chest pain: Secondary | ICD-10-CM

## 2016-05-21 LAB — EXERCISE TOLERANCE TEST
CHL CUP MPHR: 170 {beats}/min
CHL CUP RESTING HR STRESS: 73 {beats}/min
CSEPEDS: 1 s
CSEPHR: 91 %
CSEPPHR: 155 {beats}/min
Estimated workload: 8.5 METS
Exercise duration (min): 7 min
RPE: 17

## 2016-05-27 ENCOUNTER — Ambulatory Visit (INDEPENDENT_AMBULATORY_CARE_PROVIDER_SITE_OTHER): Payer: BLUE CROSS/BLUE SHIELD

## 2016-05-27 DIAGNOSIS — Z23 Encounter for immunization: Secondary | ICD-10-CM

## 2016-07-09 ENCOUNTER — Encounter: Payer: Self-pay | Admitting: Internal Medicine

## 2016-07-15 ENCOUNTER — Other Ambulatory Visit: Payer: Self-pay | Admitting: Family Medicine

## 2016-10-06 ENCOUNTER — Encounter: Payer: Self-pay | Admitting: Family Medicine

## 2016-10-15 ENCOUNTER — Ambulatory Visit: Payer: Self-pay | Admitting: Family Medicine

## 2016-10-19 ENCOUNTER — Encounter: Payer: Self-pay | Admitting: Family Medicine

## 2016-10-19 ENCOUNTER — Other Ambulatory Visit: Payer: Self-pay | Admitting: Family Medicine

## 2016-10-19 ENCOUNTER — Ambulatory Visit (INDEPENDENT_AMBULATORY_CARE_PROVIDER_SITE_OTHER): Payer: BLUE CROSS/BLUE SHIELD | Admitting: Family Medicine

## 2016-10-19 VITALS — BP 110/78 | HR 85 | Temp 98.7°F | Ht 64.0 in | Wt 194.2 lb

## 2016-10-19 DIAGNOSIS — R1013 Epigastric pain: Secondary | ICD-10-CM | POA: Insufficient documentation

## 2016-10-19 DIAGNOSIS — Z1231 Encounter for screening mammogram for malignant neoplasm of breast: Secondary | ICD-10-CM

## 2016-10-19 LAB — CBC WITH DIFFERENTIAL/PLATELET
Basophils Absolute: 0.1 10*3/uL (ref 0.0–0.1)
Basophils Relative: 0.8 % (ref 0.0–3.0)
EOS ABS: 0.1 10*3/uL (ref 0.0–0.7)
Eosinophils Relative: 1.8 % (ref 0.0–5.0)
HCT: 39.6 % (ref 36.0–46.0)
HEMOGLOBIN: 13.2 g/dL (ref 12.0–15.0)
Lymphocytes Relative: 30.2 % (ref 12.0–46.0)
Lymphs Abs: 2.1 10*3/uL (ref 0.7–4.0)
MCHC: 33.5 g/dL (ref 30.0–36.0)
MCV: 85.3 fl (ref 78.0–100.0)
MONO ABS: 0.5 10*3/uL (ref 0.1–1.0)
Monocytes Relative: 7.7 % (ref 3.0–12.0)
Neutro Abs: 4.1 10*3/uL (ref 1.4–7.7)
Neutrophils Relative %: 59.5 % (ref 43.0–77.0)
Platelets: 331 10*3/uL (ref 150.0–400.0)
RBC: 4.64 Mil/uL (ref 3.87–5.11)
RDW: 15 % (ref 11.5–15.5)
WBC: 6.9 10*3/uL (ref 4.0–10.5)

## 2016-10-19 LAB — COMPREHENSIVE METABOLIC PANEL
ALBUMIN: 4.2 g/dL (ref 3.5–5.2)
ALK PHOS: 45 U/L (ref 39–117)
ALT: 10 U/L (ref 0–35)
AST: 13 U/L (ref 0–37)
BUN: 7 mg/dL (ref 6–23)
CO2: 28 mEq/L (ref 19–32)
Calcium: 9.5 mg/dL (ref 8.4–10.5)
Chloride: 106 mEq/L (ref 96–112)
Creatinine, Ser: 0.85 mg/dL (ref 0.40–1.20)
GFR: 75.12 mL/min (ref >60.00)
Glucose, Bld: 105 mg/dL — ABNORMAL HIGH (ref 70–99)
Potassium: 4.5 mEq/L (ref 3.5–5.1)
SODIUM: 138 meq/L (ref 135–145)
TOTAL PROTEIN: 6.6 g/dL (ref 6.0–8.3)
Total Bilirubin: 0.6 mg/dL (ref 0.2–1.2)

## 2016-10-19 LAB — LIPASE: LIPASE: 14 U/L (ref 11.0–59.0)

## 2016-10-19 MED ORDER — OMEPRAZOLE 40 MG PO CPDR: 40.0000 mg | DELAYED_RELEASE_CAPSULE | Freq: Every day | ORAL | 3 refills | Status: DC

## 2016-10-19 NOTE — Progress Notes (Signed)
   Subjective:    Patient ID: Sharon Chaney, female    DOB: Jan 03, 1966, 51 y.o.   MRN: [id number]  HPI   51 year old female presents with pain in right side under ribs, central chest an back.  She has had chronic  Chest pain as below.  She has had worsening and changing pain in epigastrum, RUQ , central back since 6/15.  was severe pain in RUQ x few days.. Worse when she eats, especially after acidic foods, greasy foods. Feels like she can burp constantly, bloating.  Prilosec 20 mg helps some, but does not go away.   She has history of chronic chest pain, SOB and heavy feeling in chest associated with menses.  Seen in past in 2017 and 04/2106 for worsening symptoms. Ibuprofen has net helped  08/2015 mammogram nml 08/2015 Cbc nml,  CMET nml,  Low b12, low vit d.. Repleted, nml thyroid, nml ekg 04/2016 Treadmill stress test: nml 04/2016 FSH, LH, cbc nml Referred to [facility] for eval of heavy bleeding  Review of Systems  Constitutional: Negative for fatigue and fever.  HENT: Negative for ear pain.   Eyes: Negative for pain.  Respiratory: Negative for chest tightness and shortness of breath.   Cardiovascular: Negative for chest pain, palpitations and leg swelling.  Gastrointestinal: Positive for diarrhea, nausea and vomiting. Negative for abdominal pain, blood in stool and constipation.  Genitourinary: Negative for dysuria.       Objective:   Physical Exam  Constitutional: Vital signs are normal. She appears well-developed and well-nourished. She is cooperative.  Non-toxic appearance. She does not appear ill. No distress.  HENT:  Head: Normocephalic.  Right Ear: Hearing, tympanic membrane, external ear and ear canal normal. Tympanic membrane is not erythematous, not retracted and not bulging.  Left Ear: Hearing, tympanic membrane, external ear and ear canal normal. Tympanic membrane is not erythematous, not retracted and not bulging.  Nose: No mucosal edema or rhinorrhea. Right sinus  exhibits no maxillary sinus tenderness and no frontal sinus tenderness. Left sinus exhibits no maxillary sinus tenderness and no frontal sinus tenderness.  Mouth/Throat: Uvula is midline, oropharynx is clear and moist and mucous membranes are normal.  Eyes: Conjunctivae, EOM and lids are normal. Pupils are equal, round, and reactive to light. Lids are everted and swept, no foreign bodies found.  Neck: Trachea normal and normal range of motion. Neck supple. Carotid bruit is not present. No thyroid mass and no thyromegaly present.  Cardiovascular: Normal rate, regular rhythm, S1 normal, S2 normal, normal heart sounds, intact distal pulses and normal pulses.  Exam reveals no gallop and no friction rub.   No murmur heard. Pulmonary/Chest: Effort normal and breath sounds normal. No tachypnea. No respiratory distress. She has no decreased breath sounds. She has no wheezes. She has no rhonchi. She has no rales.  Abdominal: Soft. Normal appearance and bowel sounds are normal. There is no hepatosplenomegaly. There is tenderness in the right upper quadrant and epigastric area. There is no CVA tenderness. No hernia. Hernia confirmed negative in the ventral area.  Neurological: She is alert.  Skin: Skin is warm, dry and intact. No rash noted.  Psychiatric: Her speech is normal and behavior is normal. Judgment and thought content normal. Her mood appears not anxious. Cognition and memory are normal. She does not exhibit a depressed mood.          Assessment & Plan:

## 2016-10-19 NOTE — Patient Instructions (Signed)
Please stop at the lab to set up to have labs drawn.  Increase prilosec to 40 mg daily.  Avoid acidic foods, soda, alcohol, caffeine, spicy foods as well as greasy foods.  Call if symptoms are not improving in 2 weeks.

## 2016-10-19 NOTE — Assessment & Plan Note (Signed)
DDx: PUD, gastritis, pancreatitis, GERD, gallbladder disease.  Eval with Labs, increase PPI, avoid greasy foods and acid triggers.  If not improving and labs nml.. eval with US gallbladder and Hpylori.

## 2016-10-20 ENCOUNTER — Encounter: Payer: Self-pay | Admitting: *Deleted

## 2016-11-04 ENCOUNTER — Ambulatory Visit
Admission: RE | Admit: 2016-11-04 | Discharge: 2016-11-04 | Disposition: A | Discharge status: Home / Self Care | Payer: BLUE CROSS/BLUE SHIELD | Source: Ambulatory Visit | Attending: Family Medicine | Admitting: Family Medicine

## 2016-11-04 DIAGNOSIS — Z1231 Encounter for screening mammogram for malignant neoplasm of breast: Secondary | ICD-10-CM

## 2016-11-05 ENCOUNTER — Ambulatory Visit: Payer: BLUE CROSS/BLUE SHIELD

## 2016-11-12 ENCOUNTER — Other Ambulatory Visit: Payer: Self-pay | Admitting: Family Medicine

## 2016-12-10 ENCOUNTER — Encounter: Payer: Self-pay | Admitting: Family Medicine

## 2016-12-10 ENCOUNTER — Ambulatory Visit (INDEPENDENT_AMBULATORY_CARE_PROVIDER_SITE_OTHER): Payer: BLUE CROSS/BLUE SHIELD | Admitting: Family Medicine

## 2016-12-10 VITALS — BP 108/74 | HR 77 | Temp 97.9°F | Ht 64.0 in | Wt 196.1 lb

## 2016-12-10 DIAGNOSIS — R1011 Right upper quadrant pain: Secondary | ICD-10-CM | POA: Insufficient documentation

## 2016-12-10 DIAGNOSIS — R1013 Epigastric pain: Secondary | ICD-10-CM

## 2016-12-10 DIAGNOSIS — R350 Frequency of micturition: Secondary | ICD-10-CM | POA: Diagnosis not present

## 2016-12-10 LAB — POCT URINALYSIS DIPSTICK
BILIRUBIN UA: NEGATIVE
Blood, UA: NEGATIVE
GLUCOSE UA: NEGATIVE
KETONES UA: NEGATIVE
LEUKOCYTES UA: NEGATIVE
NITRITE UA: NEGATIVE
Protein, UA: 15
Spec Grav, UA: >=1.03 — AB (ref 1.010–1.025)
Urobilinogen, UA: 0.2 E.U./dL
pH, UA: 6 (ref 5.0–8.0)

## 2016-12-10 NOTE — Patient Instructions (Signed)
Please stop at the lab to have labs drawn and at front desk to make referral for [place].

## 2016-12-10 NOTE — Progress Notes (Signed)
Subjective:    Patient ID: Sharon Chaney, female    DOB: 04-15-1966, 51 y.o.   MRN: [id number]  HPI  51 year old female presents with    She was seen for epigastric pain RUQ pain on 6/26  Increased her PPI  Lipase, CMET and cbc were unremarkable. She had some improvement in the pain but not much   She reports today that she continues to have pain in Right flank and RUQ. Continuous worse with bending over and eating. She has noted bloating and burping. She has had some improvement inheaertburn and acid taste in her mouth.  No dysuria, no hematuria, slight increase in frequency.    Social History /Family History/Past Medical History reviewed in detail and updated in EMR if needed. She has history of chronic chest pain, SOB and heavy feeling in chest associated with menses.  Seen in past in 2017 and 04/2106 for worsening symptoms. Ibuprofen has net helped  08/2015 mammogram nml 08/2015 Cbc nml, CMET nml, Low b12, low vit d.. Repleted, nml thyroid, nml ekg 04/2016 Treadmill stress test: nml 04/2016 FSH, LH, cbc nml Referred to [facility] for eval of heavy bleeding  Review of Systems  Constitutional: Negative for fatigue and fever.  HENT: Negative for ear pain.   Eyes: Negative for pain.  Respiratory: Negative for chest tightness and shortness of breath.   Cardiovascular: Negative for chest pain, palpitations and leg swelling.  Gastrointestinal: Positive for abdominal pain and diarrhea. Negative for blood in stool, constipation, nausea and rectal pain.  Genitourinary: Negative for dysuria.       Objective:   Physical Exam  Constitutional: Vital signs are normal. She appears well-developed and well-nourished. She is cooperative.  Non-toxic appearance. She does not appear ill. No distress.  HENT:  Head: Normocephalic.  Right Ear: Hearing, tympanic membrane, external ear and ear canal normal. Tympanic membrane is not erythematous, not retracted and not bulging.  Left Ear: Hearing,  tympanic membrane, external ear and ear canal normal. Tympanic membrane is not erythematous, not retracted and not bulging.  Nose: No mucosal edema or rhinorrhea. Right sinus exhibits no maxillary sinus tenderness and no frontal sinus tenderness. Left sinus exhibits no maxillary sinus tenderness and no frontal sinus tenderness.  Mouth/Throat: Uvula is midline, oropharynx is clear and moist and mucous membranes are normal.  Eyes: Pupils are equal, round, and reactive to light. Conjunctivae, EOM and lids are normal. Lids are everted and swept, no foreign bodies found.  Neck: Trachea normal and normal range of motion. Neck supple. Carotid bruit is not present. No thyroid mass and no thyromegaly present.  Cardiovascular: Normal rate, regular rhythm, S1 normal, S2 normal, normal heart sounds, intact distal pulses and normal pulses.  Exam reveals no gallop and no friction rub.   No murmur heard. Pulmonary/Chest: Effort normal and breath sounds normal. No tachypnea. No respiratory distress. She has no decreased breath sounds. She has no wheezes. She has no rhonchi. She has no rales.  Abdominal: Soft. Normal appearance and bowel sounds are normal. There is no hepatosplenomegaly. There is tenderness in the right upper quadrant. There is tenderness at McBurney's point and positive Murphy's sign. There is no rigidity, no rebound, no guarding and no CVA tenderness.  Neurological: She is alert.  Skin: Skin is warm, dry and intact. No rash noted.  Psychiatric: Her speech is normal and behavior is normal. Judgment and thought content normal. Her mood appears not anxious. Cognition and memory are normal. She does not exhibit a  depressed mood.          Assessment & Plan:

## 2016-12-10 NOTE — Addendum Note (Signed)
Addended by: Ellamae Sia on: 12/10/2016 09:20 AM   Modules accepted: Orders

## 2016-12-10 NOTE — Assessment & Plan Note (Addendum)
Some improvement with PPI but continued pain.  Concern for gallbladder or  PUD.  Refer for [place] and eval with labs for stool Hpylori.

## 2016-12-20 ENCOUNTER — Encounter: Payer: Self-pay | Admitting: Family Medicine

## 2016-12-21 NOTE — Addendum Note (Signed)
Addended by: Ellamae Sia on: 12/21/2016 02:39 PM   Modules accepted: Orders

## 2016-12-22 LAB — HELICOBACTER PYLORI  SPECIAL ANTIGEN: H. PYLORI Antigen: NOT DETECTED

## 2016-12-23 ENCOUNTER — Ambulatory Visit
Admission: RE | Admit: 2016-12-23 | Discharge: 2016-12-23 | Disposition: A | Discharge status: Home / Self Care | Payer: BLUE CROSS/BLUE SHIELD | Source: Ambulatory Visit | Attending: Family Medicine | Admitting: Family Medicine

## 2016-12-23 DIAGNOSIS — R1011 Right upper quadrant pain: Secondary | ICD-10-CM

## 2016-12-30 ENCOUNTER — Other Ambulatory Visit: Payer: BLUE CROSS/BLUE SHIELD

## 2017-02-09 ENCOUNTER — Encounter: Payer: Self-pay | Admitting: Family Medicine

## 2017-02-10 ENCOUNTER — Other Ambulatory Visit: Payer: Self-pay | Admitting: Family Medicine

## 2017-02-11 ENCOUNTER — Encounter: Payer: Self-pay | Admitting: Primary Care

## 2017-02-11 ENCOUNTER — Ambulatory Visit (INDEPENDENT_AMBULATORY_CARE_PROVIDER_SITE_OTHER): Payer: BLUE CROSS/BLUE SHIELD | Admitting: Primary Care

## 2017-02-11 VITALS — BP 110/74 | HR 77 | Temp 98.4°F | Ht 64.0 in | Wt 195.1 lb

## 2017-02-11 DIAGNOSIS — Z23 Encounter for immunization: Secondary | ICD-10-CM

## 2017-02-11 DIAGNOSIS — K219 Gastro-esophageal reflux disease without esophagitis: Secondary | ICD-10-CM | POA: Diagnosis not present

## 2017-02-11 MED ORDER — PANTOPRAZOLE SODIUM 40 MG PO TBEC: 40.0000 mg | DELAYED_RELEASE_TABLET | Freq: Every day | ORAL | 0 refills | Status: DC

## 2017-02-11 NOTE — Progress Notes (Signed)
Subjective:    Patient ID: Sharon Chaney, female    DOB: 03/14/1966, 51 y.o.   MRN: [id number]  HPI  Sharon Chaney is a 51 year old female with a history of GERD and seasonal allergies who presents today with a chief complaint of sensation of an object to the right side of her throat.  She first noticed her symptoms three days ago. She was in the middle of eating a sandwich when she felt a sensation to the right lateral neck/throat. Other symptoms include esophageal burning, constant throat clearing, belching. She woke up at 3 am several days ago, felt acidic content moving up, belched up some yellow content and felt better.   She's been taking 400 mg every 6 hours for menstrual cramps and omeprazole 40 mg daily. She takes her Claritin sparingly. Her omeprazole was increased in Summer 2018. She's noticed some improvement on the increased does but will still experience symptoms with certain meals. She's never undergone endoscopy.   Review of Systems  Constitutional: Negative for fever.  HENT: Negative for trouble swallowing.        See HPI  Respiratory: Negative for cough.   Gastrointestinal: Negative for abdominal pain.       Past Medical History:  Diagnosis Date  . Complication of anesthesia   . Endometriosis   . Pneumonia   . PONV (postoperative nausea and vomiting)      Social History   Social History  . Marital status: Married    Spouse name: N/A  . Number of children: N/A  . Years of education: N/A   Occupational History  . Not on file.   Social History Main Topics  . Smoking status: Never Smoker  . Smokeless tobacco: Never Used  . Alcohol use No  . Drug use: No  . Sexual activity: Yes   Other Topics Concern  . Not on file   Social History Narrative  . No narrative on file    Past Surgical History:  Procedure Laterality Date  . APPENDECTOMY    . CESAREAN SECTION    . LAPAROTOMY    . TONSILLECTOMY      Family History  Problem Relation Age of Onset    . Colon cancer Father 73  . Heart disease Father   . Heart disease Paternal Grandmother   . Diabetes Paternal Grandmother   . Stroke Paternal Grandfather   . Arthritis Maternal Grandmother   . Anesthesia problems Neg Hx   . Hypotension Neg Hx   . Malignant hyperthermia Neg Hx     Allergies  Allergen Reactions  . Latex Rash    Current Outpatient Prescriptions on File Prior to Visit  Medication Sig Dispense Refill  . acetaminophen (TYLENOL) 500 MG tablet Take 2 tablets (1,000 mg total) by mouth every 6 (six) hours as needed. (Patient not taking: Reported on 02/11/2017) 30 tablet 0  . loratadine (CLARITIN) 10 MG tablet Take 10 mg by mouth daily.    Marland Kitchen omeprazole (PRILOSEC) 40 MG capsule TAKE ONE CAPSULE BY MOUTH DAILY (Patient not taking: Reported on 02/11/2017) 30 capsule 5  . SUMAtriptan (IMITREX) 50 MG tablet Take 100 mg by mouth every 2 (two) hours as needed for migraine. May repeat in 2 hours if headache persists or recurs.     No current facility-administered medications on file prior to visit.     BP 110/74   Pulse 77   Temp 98.4 F (36.9 C) (Oral)   Ht 5\' 4"  (1.626 m)  Wt 195 lb 1.9 oz (88.5 kg)   LMP 02/07/2017   SpO2 98%   BMI 33.49 kg/m    Objective:   Physical Exam  Constitutional: She appears well-nourished.  HENT:  Right Ear: Tympanic membrane and ear canal normal.  Left Ear: Tympanic membrane and ear canal normal.  Nose: Right sinus exhibits no maxillary sinus tenderness and no frontal sinus tenderness. Left sinus exhibits no maxillary sinus tenderness and no frontal sinus tenderness.  Mouth/Throat: Oropharynx is clear and moist.  Eyes: Conjunctivae are normal.  Neck: Neck supple. No thyromegaly present.  Cardiovascular: Normal rate and regular rhythm.   Pulmonary/Chest: Effort normal and breath sounds normal. She has no wheezes. She has no rales.  Lymphadenopathy:    She has no cervical adenopathy.  Skin: Skin is warm and dry.           Assessment & Plan:

## 2017-02-11 NOTE — Assessment & Plan Note (Signed)
Exam today unremarkable, no obvious masses to oropharynx, nothing odd palpated to neck around pharynx, no cervical adenopathy.   Given that she's experiencing breakthrough GERD symptoms on omeprazole coupled with globus sensation will change omeprazole to pantoprazole.   If no improvement in 1-2 weeks then consider endoscopy for further evaluation. She verbalized understanding and will update.

## 2017-02-11 NOTE — Addendum Note (Signed)
Addended by: Jacqualin Combes on: 02/11/2017 02:58 PM   Modules accepted: Orders

## 2017-02-11 NOTE — Patient Instructions (Signed)
Stop omeprazole 40 mg for acid reflux.  Start pantoprazole 40 mg for acid reflux. Take 1 tablet by mouth once daily.  Restart Claritin once daily for at least 2-3 weeks.  Please notify myself or Dr. Diona Browner if no improvement in 1-2 weeks.  It was a pleasure meeting you!   Food Choices for Gastroesophageal Reflux Disease, Adult When you have gastroesophageal reflux disease (GERD), the foods you eat and your eating habits are very important. Choosing the right foods can help ease the discomfort of GERD. Consider working with a diet and nutrition specialist (dietitian) to help you make healthy food choices. What general guidelines should I follow? Eating plan  Choose healthy foods low in fat, such as fruits, vegetables, whole grains, low-fat dairy products, and lean meat, fish, and poultry.  Eat frequent, small meals instead of three large meals each day. Eat your meals slowly, in a relaxed setting. Avoid bending over or lying down until 2-3 hours after eating.  Limit high-fat foods such as fatty meats or fried foods.  Limit your intake of oils, butter, and shortening to less than 8 teaspoons each day.  Avoid the following: ? Foods that cause symptoms. These may be different for different people. Keep a food diary to keep track of foods that cause symptoms. ? Alcohol. ? Drinking large amounts of liquid with meals. ? Eating meals during the 2-3 hours before bed.  Cook foods using methods other than frying. This may include baking, grilling, or broiling. Lifestyle   Maintain a healthy weight. Ask your health care provider what weight is healthy for you. If you need to lose weight, work with your health care provider to do so safely.  Exercise for at least 30 minutes on 5 or more days each week, or as told by your health care provider.  Avoid wearing clothes that fit tightly around your waist and chest.  Do not use any products that contain nicotine or tobacco, such as cigarettes  and e-cigarettes. If you need help quitting, ask your health care provider.  Sleep with the head of your bed raised. Use a wedge under the mattress or blocks under the bed frame to raise the head of the bed. What foods are not recommended? The items listed may not be a complete list. Talk with your dietitian about what dietary choices are best for you. Grains Pastries or quick breads with added fat. [place] toast. Vegetables Deep fried vegetables. [place] fries. Any vegetables prepared with added fat. Any vegetables that cause symptoms. For some people this may include tomatoes and tomato products, chili peppers, onions and garlic, and horseradish. Fruits Any fruits prepared with added fat. Any fruits that cause symptoms. For some people this may include citrus fruits, such as oranges, grapefruit, pineapple, and lemons. Meats and other protein foods High-fat meats, such as fatty beef or pork, hot dogs, ribs, ham, sausage, salami and bacon. Fried meat or protein, including fried fish and fried chicken. Nuts and nut butters. Dairy Whole milk and chocolate milk. Sour cream. Cream. Ice cream. Cream cheese. Milk shakes. Beverages Coffee and tea, with or without caffeine. Carbonated beverages. Sodas. Energy drinks. Fruit juice made with acidic fruits (such as orange or grapefruit). Tomato juice. Alcoholic drinks. Fats and oils Butter. Margarine. Shortening. Ghee. Sweets and desserts Chocolate and cocoa. Donuts. Seasoning and other foods Pepper. Peppermint and spearmint. Any condiments, herbs, or seasonings that cause symptoms. For some people, this may include curry, hot sauce, or vinegar-based salad dressings. Summary  When  you have gastroesophageal reflux disease (GERD), food and lifestyle choices are very important to help ease the discomfort of GERD.  Eat frequent, small meals instead of three large meals each day. Eat your meals slowly, in a relaxed setting. Avoid bending over or lying down  until 2-3 hours after eating.  Limit high-fat foods such as fatty meat or fried foods. This information is not intended to replace advice given to you by your health care provider. Make sure you discuss any questions you have with your health care provider. Document Released: 04/12/2005 Document Revised: 04/13/2016 Document Reviewed: 04/13/2016 Elsevier Interactive Patient Education  2017 [employer].

## 2017-03-03 ENCOUNTER — Encounter: Payer: Self-pay | Admitting: Primary Care

## 2017-03-03 DIAGNOSIS — K219 Gastro-esophageal reflux disease without esophagitis: Secondary | ICD-10-CM

## 2017-03-04 MED ORDER — PANTOPRAZOLE SODIUM 40 MG PO TBEC: 40.0000 mg | DELAYED_RELEASE_TABLET | Freq: Two times a day (BID) | ORAL | 2 refills | Status: DC

## 2017-03-04 NOTE — Addendum Note (Signed)
Addended by: Jearld Fenton on: 03/04/2017 12:39 PM   Modules accepted: Orders

## 2017-04-11 ENCOUNTER — Telehealth: Payer: Self-pay

## 2017-04-11 NOTE — Telephone Encounter (Signed)
PLEASE NOTE: All timestamps contained within this report are represented as [place] Standard Time. CONFIDENTIALTY NOTICE: This fax transmission is intended only for the addressee. It contains information that is legally privileged, confidential or otherwise protected from use or disclosure. If you are not the intended recipient, you are strictly prohibited from reviewing, disclosing, copying using or disseminating any of this information or taking any action in reliance on or regarding this information. If you have received this fax in error, please notify us immediately by telephone so that we can arrange for its return to [place]. Phone: [phone], Toll-Free: [phone], Fax: [phone] Page: 1 of 2 Call Id: [id number] [place] Patient Name: Sharon Chaney Gender: Female DOB: Jul 12, 1965 Age: 51 Y 10 M 23 D Return Phone Number: [phone] (Primary) Address: City/State/Zip: [place] [address] Client [facility] Night - Client Client Site [place] Physician Eliezer Lofts - MD Contact Type Call Who Is Calling Patient / Member / Family / Caregiver Call Type Triage / Clinical Relationship To Patient Self Return Phone Number [phone] (Primary) Chief Complaint Medication reaction Reason for Call Symptomatic / Request for [place] is on a new medication (tranexamic acid) 650mg  and is having stomach issues, want to kn ow if she should contiune to take it. Translation No No Triage Reason Patient declined Nurse Assessment Nurse: Rene Kocher, RN, [place] Date/Time (Eastern Time): 04/10/2017 3:19:03 PM Confirm and document reason for call. If symptomatic, describe symptoms. ---caller states she is on a new medication that is causing some diarrhea(8-9 times today). pt's 3rd dose was this morning. denies need  for triage. Tranexamic Acid is name of medication Does the patient have any new or worsening symptoms? ---Yes Will a triage be completed? ---No Select reason for no triage. ---Patient declined Please document clinical information provided and list any resource used. ---caller states she is on a new medication that is causing some diarrhea(8-9 times today). pt's 3rd dose was this morning. denies need for triage. Tranexamic Acid is name of medication Guidelines Guideline Title Affirmed Question Affirmed Notes Nurse Date/Time Eilene Ghazi Time) Medication Question Call Caller has medication question, adult has minor symptoms, caller declines triage, and triager answers question Rene Kocher, RN, [facility] 04/10/2017 3:20:50 PM Disp. Time Eilene Ghazi Time) Disposition Final User 04/10/2017 3:21:11 PM Home Care: Information or Advice Only Call Yes Coulter, RN, [place] PLEASE NOTE: All timestamps contained within this report are represented as [place] Standard Time. CONFIDENTIALTY NOTICE: This fax transmission is intended only for the addressee. It contains information that is legally privileged, confidential or otherwise protected from use or disclosure. If you are not the intended recipient, you are strictly prohibited from reviewing, disclosing, copying using or disseminating any of this information or taking any action in reliance on or regarding this information. If you have received this fax in error, please notify us immediately by telephone so that we can arrange for its return to [place]. Phone: [phone], Toll-Free: [phone], Fax: [phone] Page: 2 of 2 Call Id: [id number] [place] Disagree/Comply Comply Caller Understands Yes PreDisposition InappropriateToAsk Care Advice Given Per Guideline HOME CARE - [place]. CARE ADVICE: Routine questions about OTC or prescription medications. The triager answered questions based on reference material or prior clinical experience.  CARE ADVICE given per Medication Question Call (Adult) guideline. Comments User: [place], [place], RN Date/Time (Eastern Time): 04/10/2017 3:27:10 PM gave pt info. per [email or web address]  details User: [place], [place], RN Date/Time (Eastern Time): 04/10/2017 3:28:15 PM advised caller to contact her physician tomorrow when the office is open about stopping her medication or switching medications, and to call back if her diarrhea persists or if she becomes dehydrated.

## 2017-04-14 ENCOUNTER — Encounter: Payer: Self-pay | Admitting: Family Medicine

## 2017-04-14 ENCOUNTER — Ambulatory Visit: Payer: BLUE CROSS/BLUE SHIELD | Admitting: Family Medicine

## 2017-04-14 VITALS — BP 122/84 | HR 89 | Temp 98.9°F | Wt 195.8 lb

## 2017-04-14 DIAGNOSIS — R197 Diarrhea, unspecified: Secondary | ICD-10-CM | POA: Diagnosis not present

## 2017-04-14 LAB — BASIC METABOLIC PANEL
BUN: 9 mg/dL (ref 6–23)
CO2: 27 mEq/L (ref 19–32)
Calcium: 9.2 mg/dL (ref 8.4–10.5)
Chloride: 104 mEq/L (ref 96–112)
Creatinine, Ser: 0.84 mg/dL (ref 0.40–1.20)
GFR: 76 mL/min (ref >60.00)
Glucose, Bld: 97 mg/dL (ref 70–99)
POTASSIUM: 3.9 meq/L (ref 3.5–5.1)
Sodium: 137 mEq/L (ref 135–145)

## 2017-04-14 MED ORDER — ONDANSETRON HCL 4 MG PO TABS: 4.0000 mg | ORAL_TABLET | Freq: Three times a day (TID) | ORAL | 0 refills | Status: DC | PRN

## 2017-04-14 NOTE — Progress Notes (Signed)
Sx started about 1 week ago.  She had h/o heavy periods with cramping.  Started menses last week.  Had nausea starting over the weekend.  She felt lightheaded.  Frequently BMs.  Diarrhea but no vomiting.  HAs in the meantime.  Fatigued.  Still with diarrhea, less than prev but she hasn't been eating as much.  Lower abd pain noted.  Still making urine, has been drinking plenty of fluids.  Urine is still clear.  Mild abd discomfort at the time of exam.  Able to take applesauce and toast this AM.  Fecal urgency.  She had cramping prior to BMs.  No blood in stool but chapped from wiping.  Watery stools.  Not recently on abx.  Today compared to two days ago she may have slight dec in diarrhea but dec in PO solids noted.    No one else is sick.    Meds, vitals, and allergies reviewed.   ROS: Per HPI unless specifically indicated in ROS section   GEN: nad, alert and oriented, she looks like she does not feel well, but she clearly does not look toxic. HEENT: mucous membranes moist, OP wnl NECK: supple w/o LA CV: rrr.  PULM: ctab, no inc wob ABD: soft, +bs, abd diffusely but mildly ttp w/o rebound.  EXT: no edema Skin well perfused.

## 2017-04-14 NOTE — Patient Instructions (Signed)
Avoid dairy and greasy foods.  Go to the lab on the way out.  We'll contact you with your lab report. Zofran as needed for nausea.   Rest and fluids.  Update me as needed.

## 2017-04-15 ENCOUNTER — Encounter: Payer: Self-pay | Admitting: *Deleted

## 2017-04-15 DIAGNOSIS — R197 Diarrhea, unspecified: Secondary | ICD-10-CM | POA: Insufficient documentation

## 2017-04-15 NOTE — Assessment & Plan Note (Signed)
Okay for outpatient follow-up.  Differential diagnosis discussed with patient.  Likely benign viral source causing diarrhea that should resolve with supportive care. Avoid dairy and greasy foods.  See notes on labs.  Zofran as needed for nausea.   Rest and fluids.  Update me as needed.   She agrees.

## 2017-04-27 ENCOUNTER — Encounter: Payer: Self-pay | Admitting: Primary Care

## 2017-04-29 ENCOUNTER — Other Ambulatory Visit: Payer: Self-pay | Admitting: Internal Medicine

## 2017-04-29 DIAGNOSIS — K219 Gastro-esophageal reflux disease without esophagitis: Secondary | ICD-10-CM

## 2017-05-02 NOTE — Telephone Encounter (Signed)
Please advise if okay to refill. 

## 2017-07-25 NOTE — Telephone Encounter (Signed)
Pt seen 04/14/17.

## 2017-07-27 ENCOUNTER — Ambulatory Visit: Payer: Self-pay

## 2017-07-27 NOTE — Telephone Encounter (Addendum)
Patient phoned in with dizziness that has occurred over the last 5 days that includes vertigo. She stated it could be allergies b/c she has headache behind her rt eye and ear. She also complains of a numbing feeling on the back of her head towards the right. Occurs at no specifics times other than movement of her head will definitely bring on the dizziness. Appointment made for tomorrow with PCP.  Reason for Disposition . [1] MODERATE dizziness (e.g., vertigo; feels very unsteady, interferes with normal activities) AND [2] has NOT been evaluated by physician for this  Answer Assessment - Initial Assessment Questions 1. DESCRIPTION: "Describe your dizziness."     Feels woozy with some dizziness. 2. VERTIGO: "Do you feel like either you or the room is spinning or tilting?"      Yes, room spins 3. LIGHTHEADED: "Do you feel lightheaded?" (e.g., somewhat faint, woozy, weak upon standing)    No certain action or time. Happens at anytime. When she has movement with her head it occurs.  4. SEVERITY: "How bad is it?"  "Can you walk?"   - MILD - Feels unsteady but walking normally.   - MODERATE - Feels very unsteady when walking, but not falling; interferes with normal activities (e.g., school, work) .   - SEVERE - Unable to walk without falling (requires assistance).    Mild. It goes away when she sits unless she puts her head down. 5. ONSET:  "When did the dizziness begin?"   For 5 days  6. AGGRAVATING FACTORS: "Does anything make it worse?" (e.g., standing, change in head position)     Changing head positions. Sometimes not doing anything. 7. CAUSE: "What do you think is causing the dizziness?"   Stated it could be allergies-she has a recurring buzzing in her right ear and a numbing feeling towards the back of her head on the right side. Has constant ringing in both ears(not new). 8. RECURRENT SYMPTOM: "Have you had dizziness before?" If so, ask: "When was the last time?" "What happened that time?"   Slightly in the past. Resolved itself. 9. OTHER SYMPTOMS: "Do you have any other symptoms?" (e.g., headache, weakness, numbness, vomiting, earache)     Headache behind right eye off/on for a week.  10. PREGNANCY: "Is there any chance you are pregnant?" "When was your last menstrual period?"      no  Protocols used: DIZZINESS - VERTIGO-A-AH

## 2017-07-28 ENCOUNTER — Ambulatory Visit: Payer: BLUE CROSS/BLUE SHIELD | Admitting: Family Medicine

## 2017-07-28 ENCOUNTER — Encounter: Payer: Self-pay | Admitting: Family Medicine

## 2017-07-28 ENCOUNTER — Other Ambulatory Visit: Payer: Self-pay

## 2017-07-28 DIAGNOSIS — H5461 Unqualified visual loss, right eye, normal vision left eye: Secondary | ICD-10-CM | POA: Insufficient documentation

## 2017-07-28 DIAGNOSIS — H6991 Unspecified Eustachian tube disorder, right ear: Secondary | ICD-10-CM | POA: Insufficient documentation

## 2017-07-28 DIAGNOSIS — H6983 Other specified disorders of Eustachian tube, bilateral: Secondary | ICD-10-CM | POA: Diagnosis not present

## 2017-07-28 DIAGNOSIS — H6993 Unspecified Eustachian tube disorder, bilateral: Secondary | ICD-10-CM | POA: Insufficient documentation

## 2017-07-28 DIAGNOSIS — R42 Dizziness and giddiness: Secondary | ICD-10-CM | POA: Diagnosis not present

## 2017-07-28 MED ORDER — PREDNISONE 20 MG PO TABS: ORAL_TABLET | ORAL | 0 refills | Status: DC

## 2017-07-28 NOTE — Assessment & Plan Note (Signed)
R>L  Treat with oral steroid x 6 day course.

## 2017-07-28 NOTE — Progress Notes (Signed)
Subjective:    Patient ID: Sharon Chaney, female    DOB: Jul 30, 1965, 52 y.o.   MRN: [id number]  HPI   52 year old female present with new onset dizziness.  She reports  intermittent dizziness off and on in last week.  describes as lightheaded, woozy feeling.. Not exactyl spinning vertigo.  Triggered with movement but also at rest.  Had some right sinus pressure and ear fullness throbbing sound in ear, decreased right ear hearing, , right sided facial " crawling feeling"  Sneezing.  Right eye swollen. X 1 weeks.   No migraine.. But pain behind left eye.  Son is sick with URI.   Using claritin daily, no nasal spray.   Also comments that she does not see well in right eye... Ongoing for 1 year, worse lately. NML eye exam  11/2017, had symptom then.   Decreased vision is in inner part of eye. Social History /Family History/Past Medical History reviewed in detail and updated in EMR if needed. Blood pressure 118/80, pulse 77, temperature 98.7 F (37.1 C), temperature source Oral, height 5\' 4"  (1.626 m), weight 202 lb 8 oz (91.9 kg).  Review of Systems  Constitutional: Negative for fatigue and fever.  HENT: Negative for congestion.   Eyes: Negative for pain.  Respiratory: Negative for cough and shortness of breath.   Cardiovascular: Negative for chest pain, palpitations and leg swelling.  Gastrointestinal: Negative for abdominal pain.  Genitourinary: Negative for dysuria and vaginal bleeding.  Musculoskeletal: Negative for back pain.  Neurological: Negative for syncope, light-headedness and headaches.  Psychiatric/Behavioral: Negative for dysphoric mood.       Objective:   Physical Exam  Constitutional: She is oriented to person, place, and time. Vital signs are normal. She appears well-developed and well-nourished. She is cooperative.  Non-toxic appearance. She does not appear ill. No distress.  HENT:  Head: Normocephalic.  Right Ear: Hearing, external ear and ear canal  normal. Tympanic membrane is not erythematous, not retracted and not bulging. A middle ear effusion is present.  Left Ear: Hearing, external ear and ear canal normal. Tympanic membrane is not erythematous, not retracted and not bulging. A middle ear effusion is present.  Nose: No mucosal edema or rhinorrhea. Right sinus exhibits no maxillary sinus tenderness and no frontal sinus tenderness. Left sinus exhibits no maxillary sinus tenderness and no frontal sinus tenderness.  Mouth/Throat: Uvula is midline, oropharynx is clear and moist and mucous membranes are normal.  Eyes: Pupils are equal, round, and reactive to light. Conjunctivae, EOM and lids are normal. Lids are everted and swept, no foreign bodies found. Right eye exhibits no exudate. Left eye exhibits no exudate.  Fundoscopic exam:      The right eye shows no papilledema.       The left eye shows no papilledema.  Neck: Trachea normal and normal range of motion. Neck supple. Carotid bruit is not present. No thyroid mass and no thyromegaly present.  Cardiovascular: Normal rate, regular rhythm, S1 normal, S2 normal, normal heart sounds, intact distal pulses and normal pulses. Exam reveals no gallop and no friction rub.  No murmur heard. Pulmonary/Chest: Effort normal and breath sounds normal. No tachypnea. No respiratory distress. She has no decreased breath sounds. She has no wheezes. She has no rhonchi. She has no rales.  Abdominal: Soft. Normal appearance and bowel sounds are normal. There is no tenderness.  Neurological: She is alert and oriented to person, place, and time. She has normal strength and normal reflexes. No  cranial nerve deficit or sensory deficit. She exhibits normal muscle tone. She displays a negative Romberg sign. Coordination and gait normal. GCS eye subscore is 4. GCS verbal subscore is 5. GCS motor subscore is 6.  Nml cerebellar exam   No papilledema  Skin: Skin is warm, dry and intact. No rash noted.  Psychiatric: She  has a normal mood and affect. Her speech is normal and behavior is normal. Judgment and thought content normal. Her mood appears not anxious. Cognition and memory are normal. Cognition and memory are not impaired. She does not exhibit a depressed mood. She exhibits normal recent memory and normal remote memory.          Assessment & Plan:

## 2017-07-28 NOTE — Assessment & Plan Note (Signed)
Unusual symptom..  nml eye exam last year.   Recommend repeat eye eval given worsening.. If neg eye eval.. Consider MRI brain/orbit.

## 2017-07-28 NOTE — Assessment & Plan Note (Signed)
Most consistent with   Fluid in sinus and middle ear.

## 2017-07-28 NOTE — Patient Instructions (Signed)
Complete a course of prednisone.  Stop loratidine.. change to Xyzal at bedtime.  Call for appt for further eval if eye exam at eye [facility] is unrevealing.

## 2017-08-01 ENCOUNTER — Ambulatory Visit: Payer: Self-pay | Admitting: *Deleted

## 2017-08-01 NOTE — Telephone Encounter (Signed)
Called in c/o a reaction to the prednisone.   She was started on it Thursday for "her ears being stopped up".   Eustachian tube dysfunction by Dr. Diona Browner.  Her face is all red and burning.   Not a rash but looks like a sunburn.   Just on her face.  She had a reaction to prednisone similar to this many years ago as best as she can remember.  I routed a high priority note to Dr. Diona Browner with the information.  I let her know someone would be in touch with her. Reason for Disposition . Caller has URGENT medication question about med that PCP prescribed and triager unable to answer question  Answer Assessment - Initial Assessment Questions 1. SYMPTOMS: "Do you have any symptoms?"     Put on Prednisone Thursday for "stopped up ears".   I was also dizzy.   My face is red and on fire.   It looks like a sunburn.    2. SEVERITY: If symptoms are present, ask "Are they mild, moderate or severe?"     I'm more dizzy now.    I've not taken the prednisone today.    I'm having trouble with my acid reflux being real bad since taking the prednisone.  Protocols used: MEDICATION QUESTION CALL-A-AH

## 2017-08-01 NOTE — Telephone Encounter (Signed)
Stop prednisone. Keep a watch on rash / flushing - should get better with med leaving system.  Prednisone often makes reflux worse - just stop it. Would be ok to take some tums if it gets bad

## 2017-08-01 NOTE — Telephone Encounter (Signed)
Left detailed message on voicemail.  

## 2017-11-23 ENCOUNTER — Encounter: Payer: Self-pay | Admitting: Family Medicine

## 2017-11-25 ENCOUNTER — Ambulatory Visit: Payer: BLUE CROSS/BLUE SHIELD | Admitting: Internal Medicine

## 2017-11-25 ENCOUNTER — Encounter: Payer: Self-pay | Admitting: Internal Medicine

## 2017-11-25 VITALS — BP 116/78 | HR 77 | Temp 98.4°F | Wt 203.0 lb

## 2017-11-25 DIAGNOSIS — L02412 Cutaneous abscess of left axilla: Secondary | ICD-10-CM

## 2017-11-25 MED ORDER — SULFAMETHOXAZOLE-TRIMETHOPRIM 400-80 MG PO TABS: 1.0000 | ORAL_TABLET | Freq: Two times a day (BID) | ORAL | 0 refills | Status: DC

## 2017-11-25 NOTE — Progress Notes (Signed)
Subjective:    Patient ID: Sharon Chaney, female    DOB: 06-11-1965, 52 y.o.   MRN: [id number]     HPI  Pt presents to the clinic today with c/o a painful knot in her left armpit. She noticed this yesterday. The knit is tender to touch. She has not noticed any redness or drainage. She has not tried anything OTC for this.   Review of Systems      Past Medical History:  Diagnosis Date  . Complication of anesthesia   . Endometriosis   . Pneumonia   . PONV (postoperative nausea and vomiting)     Current Outpatient Medications  Medication Sig Dispense Refill  . acetaminophen (TYLENOL) 500 MG tablet Take 2 tablets (1,000 mg total) by mouth every 6 (six) hours as needed. 30 tablet 0  . loratadine (CLARITIN) 10 MG tablet Take 10 mg by mouth daily.    . predniSONE (DELTASONE) 20 MG tablet 3 tabs by mouth daily x 3 days, then 2 tabs by mouth daily x 2 days then 1 tab by mouth daily x 2 days 15 tablet 0  . SUMAtriptan (IMITREX) 50 MG tablet Take 100 mg by mouth every 2 (two) hours as needed for migraine. May repeat in 2 hours if headache persists or recurs.    . tranexamic acid (LYSTEDA) 650 MG TABS tablet Take 1,300 mg by mouth 3 (three) times daily. During menses     No current facility-administered medications for this visit.     Allergies  Allergen Reactions  . Promethazine Other (See Comments)    intolerant  . Latex Rash    Family History  Problem Relation Age of Onset  . Colon cancer Father 59  . Heart disease Father   . Heart disease Paternal Grandmother   . Diabetes Paternal Grandmother   . Stroke Paternal Grandfather   . Arthritis Maternal Grandmother   . Anesthesia problems Neg Hx   . Hypotension Neg Hx   . Malignant hyperthermia Neg Hx     Social History   Socioeconomic History  . Marital status: Married    Spouse name: Not on file  . Number of children: Not on file  . Years of education: Not on file  . Highest education level: Not on file    Occupational History  . Not on file  Social Needs  . Financial resource strain: Not on file  . Food insecurity:    Worry: Not on file    Inability: Not on file  . Transportation needs:    Medical: Not on file    Non-medical: Not on file  Tobacco Use  . Smoking status: Never Smoker  . Smokeless tobacco: Never Used  Substance and Sexual Activity  . Alcohol use: No  . Drug use: No  . Sexual activity: Yes  Lifestyle  . Physical activity:    Days per week: Not on file    Minutes per session: Not on file  . Stress: Not on file  Relationships  . Social connections:    Talks on phone: Not on file    Gets together: Not on file    Attends religious service: Not on file    Active member of club or organization: Not on file    Attends meetings of clubs or organizations: Not on file    Relationship status: Not on file  . Intimate partner violence:    Fear of current or ex partner: Not on file    Emotionally abused:  Not on file    Physically abused: Not on file    Forced sexual activity: Not on file  Other Topics Concern  . Not on file  Social History Narrative  . Not on file     Constitutional: Denies fever, malaise, fatigue, headache or abrupt weight changes.  Skin: Pt reports a knot under left armpit. Denies redness, rashes, or ulcercations.    No other specific complaints in a complete review of systems (except as listed in HPI above).  Objective:   Physical Exam  BP 116/78   Pulse 77   Temp 98.4 F (36.9 C) (Oral)   Wt 203 lb (92.1 kg)   SpO2 98%   BMI 34.84 kg/m  Wt Readings from Last 3 Encounters:  11/25/17 203 lb (92.1 kg)  07/28/17 202 lb 8 oz (91.9 kg)  04/14/17 195 lb 12 oz (88.8 kg)    General: Appears her stated age, well developed, well nourished in NAD. Skin: 2 small abscess noted of left axilla. Firm, unable to be trained. No signs of cellulitis.  BMET    Component Value Date/Time   NA 137 04/14/2017 1102   K 3.9 04/14/2017 1102   CL 104  04/14/2017 1102   CO2 27 04/14/2017 1102   GLUCOSE 97 04/14/2017 1102   BUN 9 04/14/2017 1102   CREATININE 0.84 04/14/2017 1102   CALCIUM 9.2 04/14/2017 1102   GFRNONAA 84 03/15/2008 1012   GFRAA 102 03/15/2008 1012    Lipid Panel     Component Value Date/Time   CHOL 224 (H) 08/27/2015 1132   TRIG 113.0 08/27/2015 1132   HDL 46.30 08/27/2015 1132   CHOLHDL 5 08/27/2015 1132   VLDL 22.6 08/27/2015 1132   LDLCALC 155 (H) 08/27/2015 1132    CBC    Component Value Date/Time   WBC 6.9 10/19/2016 1142   RBC 4.64 10/19/2016 1142   HGB 13.2 10/19/2016 1142   HCT 39.6 10/19/2016 1142   PLT 331.0 10/19/2016 1142   MCV 85.3 10/19/2016 1142   MCHC 33.5 10/19/2016 1142   RDW 15.0 10/19/2016 1142   LYMPHSABS 2.1 10/19/2016 1142   MONOABS 0.5 10/19/2016 1142   EOSABS 0.1 10/19/2016 1142   BASOSABS 0.1 10/19/2016 1142    Hgb A1C No results found for: HGBA1C          Assessment & Plan:   Abscess of Left Axilla:  Warm compresses TID until resolved eRx for Septra 1 tab BID x 7 days Avoid shaving until resolved Advised her to change the blade on her razor to avoid recontamination  Return precautions discussed Webb Silversmith, NP

## 2017-11-25 NOTE — Patient Instructions (Signed)
Skin Abscess A skin abscess is an infected area on or under your skin that contains pus and other material. An abscess can happen almost anywhere on your body. Some abscesses break open (rupture) on their own. Most continue to get worse unless they are treated. The infection can spread deeper into the body and into your blood, which can make you feel sick. Treatment usually involves draining the abscess. Follow these instructions at home: Abscess Care  If you have an abscess that has not drained, place a warm, clean, wet washcloth over the abscess several times a day. Do this as told by your doctor.  Follow instructions from your doctor about how to take care of your abscess. Make sure you: ? Cover the abscess with a bandage (dressing). ? Change your bandage or gauze as told by your doctor. ? Wash your hands with soap and water before you change the bandage or gauze. If you cannot use soap and water, use hand sanitizer.  Check your abscess every day for signs that the infection is getting worse. Check for: ? More redness, swelling, or pain. ? More fluid or blood. ? Warmth. ? More pus or a bad smell. Medicines   Take over-the-counter and prescription medicines only as told by your doctor.  If you were prescribed an antibiotic medicine, take it as told by your doctor. Do not stop taking the antibiotic even if you start to feel better. General instructions  To avoid spreading the infection: ? Do not share personal care items, towels, or hot tubs with others. ? Avoid making skin-to-skin contact with other people.  Keep all follow-up visits as told by your doctor. This is important. Contact a doctor if:  You have more redness, swelling, or pain around your abscess.  You have more fluid or blood coming from your abscess.  Your abscess feels warm when you touch it.  You have more pus or a bad smell coming from your abscess.  You have a fever.  Your muscles ache.  You have  chills.  You feel sick. Get help right away if:  You have very bad (severe) pain.  You see red streaks on your skin spreading away from the abscess. This information is not intended to replace advice given to you by your health care provider. Make sure you discuss any questions you have with your health care provider. Document Released: 09/29/2007 Document Revised: 12/07/2015 Document Reviewed: 02/19/2015 Elsevier Interactive Patient Education  2018 [employer]  

## 2017-12-02 ENCOUNTER — Ambulatory Visit: Payer: BLUE CROSS/BLUE SHIELD | Admitting: Family Medicine

## 2018-02-08 ENCOUNTER — Other Ambulatory Visit: Payer: Self-pay | Admitting: Family Medicine

## 2018-02-08 DIAGNOSIS — Z1231 Encounter for screening mammogram for malignant neoplasm of breast: Secondary | ICD-10-CM

## 2018-04-01 ENCOUNTER — Encounter: Payer: Self-pay | Admitting: Medical

## 2018-04-01 ENCOUNTER — Ambulatory Visit: Payer: BLUE CROSS/BLUE SHIELD | Admitting: Medical

## 2018-04-01 VITALS — BP 132/76 | HR 81 | Temp 98.7°F | Ht 64.0 in | Wt 216.0 lb

## 2018-04-01 DIAGNOSIS — H1032 Unspecified acute conjunctivitis, left eye: Secondary | ICD-10-CM

## 2018-04-01 DIAGNOSIS — J40 Bronchitis, not specified as acute or chronic: Secondary | ICD-10-CM | POA: Diagnosis not present

## 2018-04-01 DIAGNOSIS — J0100 Acute maxillary sinusitis, unspecified: Secondary | ICD-10-CM

## 2018-04-01 MED ORDER — DOXYCYCLINE HYCLATE 100 MG PO TABS: 100.0000 mg | ORAL_TABLET | Freq: Two times a day (BID) | ORAL | 0 refills | Status: DC

## 2018-04-01 MED ORDER — BENZONATATE 100 MG PO CAPS: 100.0000 mg | ORAL_CAPSULE | Freq: Three times a day (TID) | ORAL | 0 refills | Status: DC | PRN

## 2018-04-01 MED ORDER — FLUTICASONE PROPIONATE 50 MCG/ACT NA SUSP: 2.0000 | Freq: Every day | NASAL | 1 refills | Status: DC

## 2018-04-01 MED ORDER — TOBRAMYCIN 0.3 % OP SOLN: 2.0000 [drp] | Freq: Four times a day (QID) | OPHTHALMIC | 0 refills | Status: DC

## 2018-04-01 NOTE — Progress Notes (Signed)
Subjective:    Patient ID: Sharon Chaney, female    DOB: [birth year], 52 y.o.   MRN: [id number]  HPI   Pt in with one week of illness. Pt notes her husband has had pneumonia.  Pt states Saturday felt fatigue, chest congestion, nasal congestion, st, sinus pressure and laryngitis.  Pt felt some fever and chills early on.  Pt does have productive cough.   Left mild matting to left eye in the morning.  LMP- February 15, 2018. She states perimenopause and no very active.    Review of Systems  Constitutional: Positive for chills, fatigue and fever.  HENT: Positive for congestion, sinus pressure and sinus pain. Negative for sore throat.   Respiratory: Positive for cough. Negative for chest tightness, shortness of breath and wheezing.   Cardiovascular: Negative for chest pain and palpitations.  Gastrointestinal: Negative for abdominal pain.  Musculoskeletal: Negative for back pain and myalgias.  Skin: Negative for rash.  Neurological: Negative for dizziness and headaches.  Hematological: Negative for adenopathy. Does not bruise/bleed easily.  Psychiatric/Behavioral: Negative for behavioral problems and confusion. The patient is not nervous/anxious.     Past Medical History:  Diagnosis Date  . Complication of anesthesia   . Endometriosis   . Pneumonia   . PONV (postoperative nausea and vomiting)      Social History   Socioeconomic History  . Marital status: Married    Spouse name: Not on file  . Number of children: Not on file  . Years of education: Not on file  . Highest education level: Not on file  Occupational History  . Not on file  Social Needs  . Financial resource strain: Not on file  . Food insecurity:    Worry: Not on file    Inability: Not on file  . Transportation needs:    Medical: Not on file    Non-medical: Not on file  Tobacco Use  . Smoking status: Never Smoker  . Smokeless tobacco: Never Used  Substance and Sexual Activity  . Alcohol use: No   . Drug use: No  . Sexual activity: Yes  Lifestyle  . Physical activity:    Days per week: Not on file    Minutes per session: Not on file  . Stress: Not on file  Relationships  . Social connections:    Talks on phone: Not on file    Gets together: Not on file    Attends religious service: Not on file    Active member of club or organization: Not on file    Attends meetings of clubs or organizations: Not on file    Relationship status: Not on file  . Intimate partner violence:    Fear of current or ex partner: Not on file    Emotionally abused: Not on file    Physically abused: Not on file    Forced sexual activity: Not on file  Other Topics Concern  . Not on file  Social History Narrative  . Not on file    Past Surgical History:  Procedure Laterality Date  . APPENDECTOMY    . CESAREAN SECTION    . LAPAROTOMY    . TONSILLECTOMY      Family History  Problem Relation Age of Onset  . Colon cancer Father 70  . Heart disease Father   . Heart disease Paternal Grandmother   . Diabetes Paternal Grandmother   . Stroke Paternal Grandfather   . Arthritis Maternal Grandmother   . Anesthesia problems  Neg Hx   . Hypotension Neg Hx   . Malignant hyperthermia Neg Hx     Allergies  Allergen Reactions  . Promethazine Other (See Comments)    intolerant  . Latex Rash    Current Outpatient Medications on File Prior to Visit  Medication Sig Dispense Refill  . acetaminophen (TYLENOL) 500 MG tablet Take 2 tablets (1,000 mg total) by mouth every 6 (six) hours as needed. 30 tablet 0  . b complex vitamins tablet Take 1 tablet by mouth daily.    . cholecalciferol (VITAMIN D) 1000 units tablet Take 1,000 Units by mouth daily.    Marland Kitchen loratadine (CLARITIN) 10 MG tablet Take 10 mg by mouth daily.    Marland Kitchen omeprazole (PRILOSEC OTC) 20 MG tablet Take 20 mg by mouth daily.    . pantoprazole (PROTONIX) 40 MG tablet Take 40 mg by mouth daily.    Marland Kitchen sulfamethoxazole-trimethoprim (BACTRIM) 400-80 MG  tablet Take 1 tablet by mouth 2 (two) times daily. 14 tablet 0  . SUMAtriptan (IMITREX) 50 MG tablet Take 100 mg by mouth every 2 (two) hours as needed for migraine. May repeat in 2 hours if headache persists or recurs.    . tranexamic acid (LYSTEDA) 650 MG TABS tablet Take 1,300 mg by mouth 3 (three) times daily. During menses     No current facility-administered medications on file prior to visit.     BP 132/76 (BP Location: Left Arm, Patient Position: Sitting, Cuff Size: Large)   Pulse 81   Temp 98.7 F (37.1 C) (Oral)   Ht 5\' 4"  (1.626 m)   Wt 216 lb (98 kg)   SpO2 99%   BMI 37.08 kg/m       Objective:   Physical Exam  General  Mental Status - Alert. General Appearance - Well groomed. Not in acute distress.  Skin Rashes- No Rashes.(mid 5 mm. superificial broke down area of skin. No dc. No sourronding redness.  Eyes- left eye clear. No dc presently.  HEENT Head- Normal. Ear Auditory Canal - Left- Normal. Right - Normal.Tympanic Membrane- Left- Normal. Right- Normal. Eye Sclera/Conjunctiva- Left- Normal. Right- Normal. Nose & Sinuses Nasal Mucosa- Left-  Boggy and Congested. Right-  Boggy and  Congested.Bilateral maxillary and frontal sinus pressure. Mouth & Throat Lips: Upper Lip- Normal: no dryness, cracking, pallor, cyanosis, or vesicular eruption. Lower Lip-Normal: no dryness, cracking, pallor, cyanosis or vesicular eruption. Buccal Mucosa- Bilateral- No Aphthous ulcers. Oropharynx- No Discharge or Erythema. Tonsils: Characteristics- Bilateral- No Erythema or Congestion. Size/Enlargement- Bilateral- No enlargement. Discharge- bilateral-None.  Neck Neck- Supple. No Masses.   Chest and Lung Exam Auscultation: Breath Sounds:-Clear even and unlabored.  Cardiovascular Auscultation:Rythm- Regular, rate and rhythm. Murmurs & Other Heart Sounds:Ausculatation of the heart reveal- No Murmurs.  Lymphatic Head & Neck General Head & Neck Lymphatics: Bilateral:  Description- No Localized lymphadenopathy.       Assessment & Plan:   You appear to have bronchitis and sinusitis. Rest hydrate and tylenol for fever. I am prescribing cough medicine benzonatate, and doxycycline antibiotic. For your nasal congestion rx flonase  You should gradually get better. If not then notify us and would recommend a chest xray.  Left eye clear now but since you report yellow dc. Will rx tobrex eye solution.  Follow up in 7-10 days or as needed  At end noted small area of skin on and off supefricial skin lesion. Will heal and then come back again.(Explained if doxy does not clear and keep area clear see  pcp or be seen by derm quickly)  Mackie Pai, PA-C

## 2018-04-01 NOTE — Patient Instructions (Signed)
You appear to have bronchitis and sinusitis. Rest hydrate and tylenol for fever. I am prescribing cough medicine benzonatate, and doxycycline antibiotic. For your nasal congestion rx flonase  You should gradually get better. If not then notify us and would recommend a chest xray.  Left eye clear now but since you report yellow dc. Will rx tobrex eye solution.  Follow up in 7-10 days or as needed

## 2018-04-14 ENCOUNTER — Telehealth: Payer: Self-pay

## 2018-04-14 NOTE — Telephone Encounter (Signed)
Spoke to pt. She said she was able to rest and was feeling a little better. She will call back soon to schedule appt with the Saturday clinic

## 2018-04-14 NOTE — Telephone Encounter (Signed)
Pt said her husband has recently been in hospital for bypass surgery and is at home now. Pt cannot leave her husband and there is no one else to sit with pts husband plus pt does not feel like leaving her home.Pt has been feeling exhausted for 1 wk; pt recently exposed to URI symptoms by other family members;pt has prod cough and does not know what color phlegm is, SOB and CP on lt side that hurts all the time with a dull ache and pt is not sure how long she has had the CP this time. Pt said she has chest pain a lot.pt is also hoarse and had S/T earlier in the week but no S/T now. No H/A ,dizziness, or fever. Pt said she had stress test 1 - 2 yrs ago and it was OK but pt is not sure if symptoms now could be heart related or not. No appts available at [facility]. Pt does not want to go to UC or ED. Pt was offered appt at [facility] but pt declined due to not wanting to leave husband alone and pt not feeling like going out. Pt request note sent to Dr Diona Browner to see if med could be called in at [facility].Please advise.

## 2018-04-14 NOTE — Telephone Encounter (Signed)
Pt needs to be seen as she is having CP and SOB.

## 2018-04-15 ENCOUNTER — Ambulatory Visit: Payer: BLUE CROSS/BLUE SHIELD | Admitting: Family Medicine

## 2018-04-15 ENCOUNTER — Encounter: Payer: Self-pay | Admitting: Family Medicine

## 2018-04-15 VITALS — BP 134/86 | HR 79 | Temp 98.8°F | Resp 16 | Wt 208.0 lb

## 2018-04-15 DIAGNOSIS — R059 Cough, unspecified: Secondary | ICD-10-CM | POA: Insufficient documentation

## 2018-04-15 DIAGNOSIS — R05 Cough: Secondary | ICD-10-CM

## 2018-04-15 MED ORDER — GUAIFENESIN-CODEINE 100-10 MG/5ML PO SOLN: 5.0000 mL | Freq: Three times a day (TID) | ORAL | 0 refills | Status: DC | PRN

## 2018-04-15 NOTE — Assessment & Plan Note (Signed)
Symptoms seem to be viral in nature. Less likely for pneumonia.  - cough medicine provided  - counseled on supportive care  - given indications to follow up.

## 2018-04-15 NOTE — Progress Notes (Signed)
Sharon Chaney - 52 y.o. female MRN [id number]  Date of birth: 10/06/1965  SUBJECTIVE:  Including CC & ROS.  No chief complaint on file.   Sharon Chaney is a 52 y.o. female that is presenting with cough.  She was seen on 12/7 and diagnosed with bronchitis.  Since that time her cough has been ongoing.  She has taken doxycycline and Tessalon Perls.  She denies any fevers.  Cough seems to be worse at night.  No production with the cough.  Denies any history of asthma, COPD, or tobacco history.  Review of Systems  Constitutional: Negative for fever.  HENT: Negative for congestion.   Respiratory: Positive for cough.   Cardiovascular: Negative for chest pain.  Gastrointestinal: Negative for abdominal pain.  Musculoskeletal: Negative for back pain.  Skin: Negative for color change.  Neurological: Negative for weakness.  Hematological: Negative for adenopathy.  Psychiatric/Behavioral: Negative for agitation.    HISTORY: Past Medical, Surgical, Social, and Family History Reviewed & Updated per EMR.   Pertinent Historical Findings include:  Past Medical History:  Diagnosis Date  . Complication of anesthesia   . Endometriosis   . Pneumonia   . PONV (postoperative nausea and vomiting)     Past Surgical History:  Procedure Laterality Date  . APPENDECTOMY    . CESAREAN SECTION    . LAPAROTOMY    . TONSILLECTOMY      Allergies  Allergen Reactions  . Promethazine Other (See Comments)    intolerant  . Latex Rash    Family History  Problem Relation Age of Onset  . Colon cancer Father 71  . Heart disease Father   . Heart disease Paternal Grandmother   . Diabetes Paternal Grandmother   . Stroke Paternal Grandfather   . Arthritis Maternal Grandmother   . Anesthesia problems Neg Hx   . Hypotension Neg Hx   . Malignant hyperthermia Neg Hx      Social History   Socioeconomic History  . Marital status: Married    Spouse name: Not on file  . Number of children: Not on  file  . Years of education: Not on file  . Highest education level: Not on file  Occupational History  . Not on file  Social Needs  . Financial resource strain: Not on file  . Food insecurity:    Worry: Not on file    Inability: Not on file  . Transportation needs:    Medical: Not on file    Non-medical: Not on file  Tobacco Use  . Smoking status: Never Smoker  . Smokeless tobacco: Never Used  Substance and Sexual Activity  . Alcohol use: No  . Drug use: No  . Sexual activity: Yes  Lifestyle  . Physical activity:    Days per week: Not on file    Minutes per session: Not on file  . Stress: Not on file  Relationships  . Social connections:    Talks on phone: Not on file    Gets together: Not on file    Attends religious service: Not on file    Active member of club or organization: Not on file    Attends meetings of clubs or organizations: Not on file    Relationship status: Not on file  . Intimate partner violence:    Fear of current or ex partner: Not on file    Emotionally abused: Not on file    Physically abused: Not on file    Forced sexual activity:  Not on file  Other Topics Concern  . Not on file  Social History Narrative  . Not on file     PHYSICAL EXAM:  VS: BP 134/86   Pulse 79   Temp 98.8 F (37.1 C) (Oral)   Resp 16   Wt 208 lb (94.3 kg)   SpO2 98%   BMI 35.70 kg/m  Physical Exam Gen: NAD, alert, cooperative with exam, well-appearing ENT: normal lips, normal nasal mucosa, tympanic membranes clear and intact bilaterally, normal oropharynx, no cervical lymphadenopathy Eye: normal EOM, normal conjunctiva and lids CV:  no edema, +2 pedal pulses, regular rate and rhythm, S1-S2   Resp: no accessory muscle use, non-labored, clear to auscultation bilaterally, no crackles or wheezes  Skin: no rashes, no areas of induration  Neuro: normal tone, normal sensation to touch Psych:  normal insight, alert and oriented MSK: Normal gait, normal strength        ASSESSMENT & PLAN:   Cough Symptoms seem to be viral in nature. Less likely for pneumonia.  - cough medicine provided  - counseled on supportive care  - given indications to follow up.

## 2018-04-15 NOTE — Patient Instructions (Signed)
Nice to meet you  Please try the cough medicine  Please try things such as zyrtec-D or allegra-D which is an antihistamine and decongestant.  Honey, Vicks, lozenges, and a humidifier can help with a cough.   Heavy holidays!!

## 2018-04-17 ENCOUNTER — Telehealth: Payer: Self-pay

## 2018-04-17 NOTE — Telephone Encounter (Signed)
Noted  

## 2018-04-17 NOTE — Telephone Encounter (Signed)
[place] faxed note; per chart review tab; [facility],pt said she feels little better; pt is really stressed. Pt will cb for appt if needed. TH note in Dr Rometta Emery in box. FYI to Dr Diona Browner.

## 2018-04-20 ENCOUNTER — Ambulatory Visit
Admission: RE | Admit: 2018-04-20 | Discharge: 2018-04-20 | Disposition: A | Discharge status: Home / Self Care | Payer: BLUE CROSS/BLUE SHIELD | Source: Ambulatory Visit | Attending: Family Medicine | Admitting: Family Medicine

## 2018-04-20 DIAGNOSIS — Z1231 Encounter for screening mammogram for malignant neoplasm of breast: Secondary | ICD-10-CM

## 2018-06-27 ENCOUNTER — Ambulatory Visit: Payer: BLUE CROSS/BLUE SHIELD | Admitting: Family Medicine

## 2018-06-27 ENCOUNTER — Encounter: Payer: Self-pay | Admitting: Primary Care

## 2018-06-27 ENCOUNTER — Ambulatory Visit: Payer: BLUE CROSS/BLUE SHIELD | Admitting: Primary Care

## 2018-06-27 ENCOUNTER — Ambulatory Visit: Payer: Self-pay | Admitting: Family Medicine

## 2018-06-27 DIAGNOSIS — R0789 Other chest pain: Secondary | ICD-10-CM

## 2018-06-27 DIAGNOSIS — M79604 Pain in right leg: Secondary | ICD-10-CM | POA: Diagnosis not present

## 2018-06-27 DIAGNOSIS — M25562 Pain in left knee: Secondary | ICD-10-CM | POA: Insufficient documentation

## 2018-06-27 DIAGNOSIS — R5382 Chronic fatigue, unspecified: Secondary | ICD-10-CM

## 2018-06-27 DIAGNOSIS — M79606 Pain in leg, unspecified: Secondary | ICD-10-CM | POA: Insufficient documentation

## 2018-06-27 LAB — CBC
HEMATOCRIT: 40.4 % (ref 36.0–46.0)
Hemoglobin: 13.2 g/dL (ref 12.0–15.0)
MCHC: 32.8 g/dL (ref 30.0–36.0)
MCV: 82.5 fl (ref 78.0–100.0)
Platelets: 325 10*3/uL (ref 150.0–400.0)
RBC: 4.9 Mil/uL (ref 3.87–5.11)
RDW: 16.9 % — ABNORMAL HIGH (ref 11.5–15.5)
WBC: 6.7 10*3/uL (ref 4.0–10.5)

## 2018-06-27 LAB — BASIC METABOLIC PANEL
BUN: 12 mg/dL (ref 6–23)
CO2: 26 mEq/L (ref 19–32)
CREATININE: 0.77 mg/dL (ref 0.40–1.20)
Calcium: 9.4 mg/dL (ref 8.4–10.5)
Chloride: 104 mEq/L (ref 96–112)
GFR: 78.69 mL/min (ref >60.00)
Glucose, Bld: 91 mg/dL (ref 70–99)
Potassium: 4.2 mEq/L (ref 3.5–5.1)
Sodium: 138 mEq/L (ref 135–145)

## 2018-06-27 LAB — VITAMIN B12: Vitamin B-12: 399 pg/mL (ref 211–911)

## 2018-06-27 LAB — TSH: TSH: 1.43 u[IU]/mL (ref 0.35–4.50)

## 2018-06-27 LAB — VITAMIN D 25 HYDROXY (VIT D DEFICIENCY, FRACTURES): VITD: 29.82 ng/mL — ABNORMAL LOW (ref 30.00–100.00)

## 2018-06-27 NOTE — Assessment & Plan Note (Addendum)
Likely secondary to anxiety/stress. Normal stress test in 2018. Does not fit clinical picture of ACS. ECG today with NSR with rate of 72, normal axis, good R wave progression. No ST elevation or depression. Suspect symptoms due to stress, will check labs as well.

## 2018-06-27 NOTE — Progress Notes (Signed)
Subjective:    Patient ID: Sharon Chaney, female    DOB: 1965-07-08, 53 y.o.   MRN: [id number]  HPI  Sharon Chaney is a 53 year old female with a history of chronic pelvic pain, menorrhagia, dysfunctional uterine bleeding, endometriosis, chronic fatigue, GERD who presents today with multiple complaints.  1) Lower Extremity Pain: Located to the anterior right lower extremity with radiation down to dorsal foot. Chronic pain to right upper thigh and hip.  She describes pain as achy/burning/throbbing that is constant, worse sometimes with walking, resting, riding an exercise bike. Not on hormonal treatment. Began around December 2019 around when her husband underwent cardiac surgery, has been under a lot of stress since. She also noticed a red rash (3 blisters) at the time which lasted one week. She does have a picture today. History of shingles in the past. [facility], the provider didn't look at her rash that was present, he mostly focused on her cough/cold symptoms. She's been taking Tylenol OTC. She denies erythema, calf swelling. She is active with walking 2-3 times weekly, 1 mile on average, notices pain intermittently.   2) Fatigue/Stress: Present for the last 2-3 months. Also with chronic intermittent dizziness, pinching sensation to her substernal chest, insomnia. Has been under a lot of stress as her husband underwent a major surgery in December 2019. Cardiac stress test in 2018 which was unremarkable, mammogram in December 2019 which was negative. Evaluated by optometrist in 2019, unremarkable exam. LMP 06/11/18. Strong family history of cardiac disease in father.   Review of Systems  Constitutional: Positive for fatigue.  Respiratory: Negative for shortness of breath.   Cardiovascular: Negative for chest pain and palpitations.  Skin: Negative for color change.  Neurological:       Chronic dizziness  Psychiatric/Behavioral: The patient is nervous/anxious.         Past Medical History:  Diagnosis Date  . Complication of anesthesia   . Endometriosis   . Pneumonia   . PONV (postoperative nausea and vomiting)      Social History   Socioeconomic History  . Marital status: Married    Spouse name: Not on file  . Number of children: Not on file  . Years of education: Not on file  . Highest education level: Not on file  Occupational History  . Not on file  Social Needs  . Financial resource strain: Not on file  . Food insecurity:    Worry: Not on file    Inability: Not on file  . Transportation needs:    Medical: Not on file    Non-medical: Not on file  Tobacco Use  . Smoking status: Never Smoker  . Smokeless tobacco: Never Used  Substance and Sexual Activity  . Alcohol use: No  . Drug use: No  . Sexual activity: Yes  Lifestyle  . Physical activity:    Days per week: Not on file    Minutes per session: Not on file  . Stress: Not on file  Relationships  . Social connections:    Talks on phone: Not on file    Gets together: Not on file    Attends religious service: Not on file    Active member of club or organization: Not on file    Attends meetings of clubs or organizations: Not on file    Relationship status: Not on file  . Intimate partner violence:    Fear of current or ex partner: Not  on file    Emotionally abused: Not on file    Physically abused: Not on file    Forced sexual activity: Not on file  Other Topics Concern  . Not on file  Social History Narrative  . Not on file    Past Surgical History:  Procedure Laterality Date  . APPENDECTOMY    . CESAREAN SECTION    . LAPAROTOMY    . TONSILLECTOMY      Family History  Problem Relation Age of Onset  . Colon cancer Father 16  . Heart disease Father   . Heart disease Paternal Grandmother   . Diabetes Paternal Grandmother   . Stroke Paternal Grandfather   . Arthritis Maternal Grandmother   . Anesthesia problems Neg Hx   . Hypotension Neg Hx   .  Malignant hyperthermia Neg Hx     Allergies  Allergen Reactions  . Promethazine Other (See Comments)    intolerant  . Latex Rash    Current Outpatient Medications on File Prior to Visit  Medication Sig Dispense Refill  . acetaminophen (TYLENOL) 500 MG tablet Take 2 tablets (1,000 mg total) by mouth every 6 (six) hours as needed. 30 tablet 0  . omeprazole (PRILOSEC OTC) 20 MG tablet Take 20 mg by mouth daily.    Marland Kitchen b complex vitamins tablet Take 1 tablet by mouth daily.    . cholecalciferol (VITAMIN D) 1000 units tablet Take 1,000 Units by mouth daily.    Marland Kitchen loratadine (CLARITIN) 10 MG tablet Take 10 mg by mouth daily.    . SUMAtriptan (IMITREX) 50 MG tablet Take 100 mg by mouth every 2 (two) hours as needed for migraine. May repeat in 2 hours if headache persists or recurs.     No current facility-administered medications on file prior to visit.     BP 120/82   Pulse 71   Temp 98 F (36.7 C) (Oral)   Ht 5\' 4"  (1.626 m)   Wt 201 lb 12 oz (91.5 kg)   SpO2 98%   BMI 34.63 kg/m    Objective:   Physical Exam  Constitutional: She appears well-nourished.  Neck: Neck supple.  Cardiovascular: Normal rate and regular rhythm.  No murmur heard. Pulses:      Popliteal pulses are 2+ on the right side and 2+ on the left side.       Dorsalis pedis pulses are 2+ on the right side and 2+ on the left side.       Posterior tibial pulses are 2+ on the right side and 2+ on the left side.  Calf size equal bilaterally with measurement.  Respiratory: Effort normal and breath sounds normal.  Skin: Skin is warm and dry. No rash noted. No erythema.  Psychiatric: She has a normal mood and affect.  Appears stressed, tearful during visit           Assessment & Plan:

## 2018-06-27 NOTE — Patient Instructions (Signed)
Stop by the lab prior to leaving today. I will notify you of your results once received.   Follow up with Dr. Diona Browner regarding your anxiety/stress levels.   It was a pleasure to see you today!

## 2018-06-27 NOTE — Assessment & Plan Note (Signed)
Acute on chronic x 2-3 months. Likely secondary to anxiety/stress. Check labs today including TSH, BMP, Vitamin D, and Vitamin B12, CBC. Encouraged her to discuss anxiety/stress levels with PCP, discussed options for treatment today including medication vs therapy. She would like to discuss with PCP first which is reasonable.

## 2018-06-27 NOTE — Assessment & Plan Note (Signed)
Since December 2019. Picture today of rash is very representative of shingles that went untreated. Rash followed dermatome of posterior calf. She was under a lot of stress and continues with stress. Fits clinical picture of shingles. Suspect symptoms of burning/aching could be residual symptoms from shingles. Low suspicion for DVT, cellulitis. 2+ pedal pulses without intermittent claudication, doubt arterial cause. Could be also MSK as she does have chronic right lower extremity pain. Reassurance provided today. Encouraged to follow up with PCP.

## 2018-12-13 ENCOUNTER — Other Ambulatory Visit: Payer: Self-pay | Admitting: Medical

## 2018-12-21 ENCOUNTER — Telehealth: Payer: Self-pay | Admitting: Family Medicine

## 2018-12-21 DIAGNOSIS — Z1211 Encounter for screening for malignant neoplasm of colon: Secondary | ICD-10-CM

## 2018-12-21 NOTE — Telephone Encounter (Signed)
Patient went to her Gyn last week and they referred her to [facility] for a colonoscopy.  Patient doesn't want to go to [facility].  Patient is requesting Dr.Bedsole refer her to [facility] for the Colonoscopy.  Patient prefer early on Monday or Friday morning.

## 2018-12-22 NOTE — Telephone Encounter (Signed)
Referral made 

## 2019-01-24 ENCOUNTER — Encounter: Payer: Self-pay | Admitting: Family Medicine

## 2019-03-17 ENCOUNTER — Encounter: Payer: Self-pay | Admitting: Family Medicine

## 2019-03-17 ENCOUNTER — Ambulatory Visit (INDEPENDENT_AMBULATORY_CARE_PROVIDER_SITE_OTHER): Payer: BC Managed Care – PPO | Admitting: Family Medicine

## 2019-03-17 VITALS — BP 149/99 | HR 72 | Temp 97.3°F | Wt 210.0 lb

## 2019-03-17 DIAGNOSIS — J0140 Acute pansinusitis, unspecified: Secondary | ICD-10-CM

## 2019-03-17 MED ORDER — CEFDINIR 300 MG PO CAPS: 300.0000 mg | ORAL_CAPSULE | Freq: Two times a day (BID) | ORAL | 0 refills | Status: DC

## 2019-03-17 NOTE — Progress Notes (Signed)
Virtual Visit via Video Note  I connected with [place] on 03/17/19 at 11:20 AM EST by a video enabled telemedicine application and verified that I am speaking with the correct person using two identifiers.  Location: Patient: home alone Provider: home    I discussed the limitations of evaluation and management by telemedicine and the availability of in person appointments. The patient expressed understanding and agreed to proceed.  History of Present Illness: Pt is home c/o sinus pressure and ear pressure x 1 weeks    No fevers She takes claritin and flonase daily  She has no sick contacts that she knows of  Her ears feel plugged and is constantly clearing her throat  She works from home    Observations/Objective: Vitals:   03/17/19 1027  BP: (!) 149/99  Pulse: 72  Temp: (!) 97.3 F (36.3 C)   Pt is in NAD  Assessment and Plan: 1. Acute non-recurrent pansinusitis con't flonase and claritin Add abx If no better by Monday pt will get covid test  - cefdinir (OMNICEF) 300 MG capsule; Take 1 capsule (300 mg total) by mouth 2 (two) times daily.  Dispense: 14 capsule; Refill: 0   Follow Up Instructions:    I discussed the assessment and treatment plan with the patient. The patient was provided an opportunity to ask questions and all were answered. The patient agreed with the plan and demonstrated an understanding of the instructions.   The patient was advised to call back or seek an in-person evaluation if the symptoms worsen or if the condition fails to improve as anticipated.  I provided 15 minutes of non-face-to-face time during this encounter.   Ann Held, DO

## 2019-03-19 ENCOUNTER — Telehealth: Payer: Self-pay

## 2019-03-19 NOTE — Telephone Encounter (Signed)
[facility] Night - Client TELEPHONE ADVICE RECORD [facility] Patient Name: Sharon Chaney Gender: Female DOB: 10/21/1965 Age: 53 Y 9 M 29 D Return Phone Number: [phone] (Primary) Address: City/State/Zip: [place] [address] Client [facility] Night - Client Client [facility] [place] Physician Eliezer Lofts - MD Contact Type Call Who Is Calling Patient / Member / Family / Caregiver Call Type Triage / Clinical Relationship To Patient Self Return Phone Number [phone] (Primary) Chief Complaint Headache Reason for Call Symptomatic / Request for [name] states she is having congestion with sore throat and headache. Translation No Nurse Assessment Nurse: Susy Manor, RN, Megan Date/Time (Eastern Time): 03/17/2019 10:06:55 AM Confirm and document reason for call. If symptomatic, describe symptoms. ---Caller states she has congestion and headache. Denies fever. Has the patient had close contact with a person known or suspected to have the novel coronavirus illness OR traveled / lives in area with major community spread (including international travel) in the last 14 days from the onset of symptoms? * If Asymptomatic, screen for exposure and travel within the last 14 days. ---No Does the patient have any new or worsening symptoms? ---Yes Will a triage be completed? ---Yes Related visit to physician within the last 2 weeks? ---No Does the PT have any chronic conditions? (i.e. diabetes, asthma, this includes High risk factors for pregnancy, etc.) ---No Is the patient pregnant or possibly pregnant? (Ask all females between the ages of 33-55) ---No Is this a behavioral health or substance abuse call? ---No Guidelines Guideline Title Affirmed Question Affirmed Notes Nurse Date/Time Eilene Ghazi Time) Sinus Pain or Congestion [1] Redness or swelling on the cheek, forehead  or around the eye AND [2] no fever Susy Manor, RN, Megan 03/17/2019 10:08:43 AM Disp. Time Eilene Ghazi Time) Disposition Final User PLEASE NOTE: All timestamps contained within this report are represented as [place] Standard Time. CONFIDENTIALTY NOTICE: This fax transmission is intended only for the addressee. It contains information that is legally privileged, confidential or otherwise protected from use or disclosure. If you are not the intended recipient, you are strictly prohibited from reviewing, disclosing, copying using or disseminating any of this information or taking any action in reliance on or regarding this information. If you have received this fax in error, please notify us immediately by telephone so that we can arrange for its return to [place]. Phone: [phone], Toll-Free: [phone], Fax: [phone] Page: 2 of 2 Call Id: [id number] 03/17/2019 10:11:49 AM See HCP within 4 Hours (or PCP triage) Yes Susy Manor, RN, Delphia Grates Disagree/Comply Comply Caller Understands Yes PreDisposition Did not know what to do Care Advice Given Per Guideline SEE HCP WITHIN 4 HOURS (OR PCP TRIAGE): CALL BACK IF: * You become worse. CARE ADVICE given per Sinus Pain or Congestion (Adult) guideline. Referrals [facility]

## 2019-03-19 NOTE — Telephone Encounter (Signed)
Per chart review tab pt had [facility] on [facility]/17/19.

## 2019-03-27 MED ORDER — FLUCONAZOLE 150 MG PO TABS: 150.0000 mg | ORAL_TABLET | Freq: Once | ORAL | 0 refills | Status: AC

## 2019-04-02 ENCOUNTER — Other Ambulatory Visit: Payer: Self-pay | Admitting: Family Medicine

## 2019-04-02 DIAGNOSIS — Z1231 Encounter for screening mammogram for malignant neoplasm of breast: Secondary | ICD-10-CM

## 2019-05-15 ENCOUNTER — Ambulatory Visit
Admission: RE | Admit: 2019-05-15 | Discharge: 2019-05-15 | Disposition: A | Discharge status: Home / Self Care | Payer: BC Managed Care – PPO | Source: Ambulatory Visit | Attending: Family Medicine | Admitting: Family Medicine

## 2019-05-15 ENCOUNTER — Other Ambulatory Visit: Payer: Self-pay

## 2019-05-15 DIAGNOSIS — Z1231 Encounter for screening mammogram for malignant neoplasm of breast: Secondary | ICD-10-CM

## 2019-06-29 ENCOUNTER — Other Ambulatory Visit: Payer: Self-pay

## 2019-06-29 ENCOUNTER — Ambulatory Visit: Payer: BC Managed Care – PPO | Admitting: Family Medicine

## 2019-06-29 ENCOUNTER — Encounter: Payer: Self-pay | Admitting: Family Medicine

## 2019-06-29 VITALS — BP 120/82 | HR 72 | Temp 98.3°F | Ht 64.25 in | Wt 213.2 lb

## 2019-06-29 DIAGNOSIS — R0789 Other chest pain: Secondary | ICD-10-CM

## 2019-06-29 DIAGNOSIS — R42 Dizziness and giddiness: Secondary | ICD-10-CM | POA: Diagnosis not present

## 2019-06-29 DIAGNOSIS — H8110 Benign paroxysmal vertigo, unspecified ear: Secondary | ICD-10-CM | POA: Diagnosis not present

## 2019-06-29 DIAGNOSIS — K219 Gastro-esophageal reflux disease without esophagitis: Secondary | ICD-10-CM

## 2019-06-29 MED ORDER — OMEPRAZOLE MAGNESIUM 20 MG PO TBEC: 20.0000 mg | DELAYED_RELEASE_TABLET | Freq: Every day | ORAL | 11 refills | Status: DC | PRN

## 2019-06-29 MED ORDER — OMEPRAZOLE 20 MG PO CPDR: 20.0000 mg | DELAYED_RELEASE_CAPSULE | Freq: Every day | ORAL | 11 refills | Status: DC

## 2019-06-29 NOTE — Patient Instructions (Signed)
Start desensitization exercises.Marland Kitchen do 3-4 times daily.  Can use meclizine as needed.   Start flonase back 2 sprays per nostril daily.  Call if not improving in 2 weeks.  Benign Positional Vertigo Vertigo is the feeling that you or your surroundings are moving when they are not. Benign positional vertigo is the most common form of vertigo. This is usually a harmless condition (benign). This condition is positional. This means that symptoms are triggered by certain movements and positions. This condition can be dangerous if it occurs while you are doing something that could cause harm to you or others. This includes activities such as driving or operating machinery. What are the causes? In many cases, the cause of this condition is not known. It may be caused by a disturbance in an area of the inner ear that helps your brain to sense movement and balance. This disturbance can be caused by:  Viral infection (labyrinthitis).  Head injury.  Repetitive motion, such as jumping, dancing, or running. What increases the risk? You are more likely to develop this condition if:  You are a woman.  You are 29 years of age or older. What are the signs or symptoms? Symptoms of this condition usually happen when you move your head or your eyes in different directions. Symptoms may start suddenly, and usually last for less than a minute. They include:  Loss of balance and falling.  Feeling like you are spinning or moving.  Feeling like your surroundings are spinning or moving.  Nausea and vomiting.  Blurred vision.  Dizziness.  Involuntary eye movement (nystagmus). Symptoms can be mild and cause only minor problems, or they can be severe and interfere with daily life. Episodes of benign positional vertigo may return (recur) over time. Symptoms may improve over time. How is this diagnosed? This condition may be diagnosed based on:  Your medical history.  Physical exam of the head, neck, and  ears.  Tests, such as: ? MRI. ? CT scan. ? Eye movement tests. Your health care provider may ask you to change positions quickly while he or she watches you for symptoms of benign positional vertigo, such as nystagmus. Eye movement may be tested with a variety of exams that are designed to evaluate or stimulate vertigo. ? An electroencephalogram (EEG). This records electrical activity in your brain. ? Hearing tests. You may be referred to a health care provider who specializes in ear, nose, and throat (ENT) problems (otolaryngologist) or a provider who specializes in disorders of the nervous system (neurologist). How is this treated?  This condition may be treated in a session in which your health care provider moves your head in specific positions to adjust your inner ear back to normal. Treatment for this condition may take several sessions. Surgery may be needed in severe cases, but this is rare. In some cases, benign positional vertigo may resolve on its own in 2-4 weeks. Follow these instructions at home: Safety  Move slowly. Avoid sudden body or head movements or certain positions, as told by your health care provider.  Avoid driving until your health care provider says it is safe for you to do so.  Avoid operating heavy machinery until your health care provider says it is safe for you to do so.  Avoid doing any tasks that would be dangerous to you or others if vertigo occurs.  If you have trouble walking or keeping your balance, try using a cane for stability. If you feel dizzy or unstable, sit  down right away.  Return to your normal activities as told by your health care provider. Ask your health care provider what activities are safe for you. General instructions  Take over-the-counter and prescription medicines only as told by your health care provider.  Drink enough fluid to keep your urine pale yellow.  Keep all follow-up visits as told by your health care provider. This  is important. Contact a health care provider if:  You have a fever.  Your condition gets worse or you develop new symptoms.  Your family or friends notice any behavioral changes.  You have nausea or vomiting that gets worse.  You have numbness or a "pins and needles" sensation. Get help right away if you:  Have difficulty speaking or moving.  Are always dizzy.  Faint.  Develop severe headaches.  Have weakness in your legs or arms.  Have changes in your hearing or vision.  Develop a stiff neck.  Develop sensitivity to light. Summary  Vertigo is the feeling that you or your surroundings are moving when they are not. Benign positional vertigo is the most common form of vertigo.  The cause of this condition is not known. It may be caused by a disturbance in an area of the inner ear that helps your brain to sense movement and balance.  Symptoms include loss of balance and falling, feeling that you or your surroundings are moving, nausea and vomiting, and blurred vision.  This condition can be diagnosed based on symptoms, physical exam, and other tests, such as MRI, CT scan, eye movement tests, and hearing tests.  Follow safety instructions as told by your health care provider. You will also be told when to contact your health care provider in case of problems. This information is not intended to replace advice given to you by your health care provider. Make sure you discuss any questions you have with your health care provider. Document Revised: 09/21/2017 Document Reviewed: 09/21/2017 Elsevier Patient Education  [employer].

## 2019-06-29 NOTE — Progress Notes (Signed)
Chief Complaint  Patient presents with  . Dizziness  . Medication Refill    would like rx for omeprazole instead of OTC    History of Present Illness: HPI  54 year old female with history of  Migraine, ETD,  GERD and asthma presents with new onset dizziness ongoing in last 24 hours  She describes the dizziness as:  Sudden onset vertigo.. room spinning.  Balance off.  Rolling over triggers, moving worsens.  Seems to be louder chronic tinnitus.  Mild nausea. Has a headache... in last 2 days, feels like migraine. Improved some with 400 mg ibuprofen. Has been treated for cracked tooth, recently.  Allergies are beginning to act up..  Some congestion, eyes itchy and dry. Using daily loratidine.  Has history of chronic chest pain... some numbness/cold in left arm.. yesterday.. now gone.  No SOB.  She has had similar issue in 2019.. treated for ETD.   GERD: Omeprazole helps this issue and she would like RX instead of getting it OTC.  This visit occurred during the SARS-CoV-2 public health emergency.  Safety protocols were in place, including screening questions prior to the visit, additional usage of staff PPE, and extensive cleaning of exam room while observing appropriate contact time as indicated for disinfecting solutions.   COVID 19 screen:  No recent travel or known exposure to COVID19 The patient denies respiratory symptoms of COVID 19 at this time. The importance of social distancing was discussed today.     Review of Systems  Constitutional: Negative for chills and fever.  HENT: Negative for congestion and ear pain.   Eyes: Negative for pain and redness.  Respiratory: Negative for cough and shortness of breath.   Cardiovascular: Negative for chest pain, palpitations and leg swelling.  Gastrointestinal: Negative for abdominal pain, blood in stool, constipation, diarrhea, nausea and vomiting.  Genitourinary: Negative for dysuria.  Musculoskeletal: Negative for falls  and myalgias.  Skin: Negative for rash.  Neurological: Negative for dizziness.  Psychiatric/Behavioral: Negative for depression. The patient is not nervous/anxious.       Past Medical History:  Diagnosis Date  . Complication of anesthesia   . Endometriosis   . Pneumonia   . PONV (postoperative nausea and vomiting)     reports that she has never smoked. She has never used smokeless tobacco. She reports that she does not drink alcohol or use drugs.   Current Outpatient Medications:  .  acetaminophen (TYLENOL) 500 MG tablet, Take 2 tablets (1,000 mg total) by mouth every 6 (six) hours as needed., Disp: 30 tablet, Rfl: 0 .  CALCIUM PO, Take 1 tablet by mouth in the morning and at bedtime., Disp: , Rfl:  .  cholecalciferol (VITAMIN D) 1000 units tablet, Take 1,000 Units by mouth daily., Disp: , Rfl:  .  fluticasone (FLONASE) 50 MCG/ACT nasal spray, SPRAY 2 SPRAYS INTO EACH NOSTRIL EVERY DAY, Disp: 16 mL, Rfl: 1 .  loratadine (CLARITIN) 10 MG tablet, Take 10 mg by mouth daily., Disp: , Rfl:  .  omeprazole (PRILOSEC OTC) 20 MG tablet, Take 20 mg by mouth daily as needed. , Disp: , Rfl:    Observations/Objective: Blood pressure 120/82, pulse 72, temperature 98.3 F (36.8 C), temperature source Temporal, height 5' 4.25" (1.632 m), weight 213 lb 4 oz (96.7 kg), last menstrual period 06/08/2019, SpO2 98 %.  Physical Exam Constitutional:      General: She is not in acute distress.    Appearance: Normal appearance. She is well-developed. She is not ill-appearing  or toxic-appearing.  HENT:     Head: Normocephalic.     Right Ear: Hearing, tympanic membrane, ear canal and external ear normal. Tympanic membrane is not erythematous, retracted or bulging.     Left Ear: Hearing, tympanic membrane, ear canal and external ear normal. Tympanic membrane is not erythematous, retracted or bulging.     Nose: No mucosal edema or rhinorrhea.     Right Sinus: No maxillary sinus tenderness or frontal sinus  tenderness.     Left Sinus: No maxillary sinus tenderness or frontal sinus tenderness.     Mouth/Throat:     Pharynx: Uvula midline.  Eyes:     General: Lids are normal. Lids are everted, no foreign bodies appreciated.     Conjunctiva/sclera: Conjunctivae normal.     Pupils: Pupils are equal, round, and reactive to light.  Neck:     Thyroid: No thyroid mass or thyromegaly.     Vascular: No carotid bruit.     Trachea: Trachea normal.  Cardiovascular:     Rate and Rhythm: Normal rate and regular rhythm.     Pulses: Normal pulses.     Heart sounds: Normal heart sounds, S1 normal and S2 normal. No murmur. No friction rub. No gallop.   Pulmonary:     Effort: Pulmonary effort is normal. No tachypnea or respiratory distress.     Breath sounds: Normal breath sounds. No decreased breath sounds, wheezing, rhonchi or rales.  Abdominal:     General: Bowel sounds are normal.     Palpations: Abdomen is soft.     Tenderness: There is no abdominal tenderness.  Musculoskeletal:     Cervical back: Normal range of motion and neck supple.  Skin:    General: Skin is warm and dry.     Findings: No rash.  Neurological:     Mental Status: She is alert and oriented to person, place, and time.     GCS: GCS eye subscore is 4. GCS verbal subscore is 5. GCS motor subscore is 6.     Cranial Nerves: No cranial nerve deficit.     Sensory: No sensory deficit.     Motor: No abnormal muscle tone.     Coordination: Coordination normal.     Gait: Gait normal.     Deep Tendon Reflexes: Reflexes are normal and symmetric.     Comments: Nml cerebellar exam   No papilledema   Vertigo triggered with  Marye Round, slight nystagmus seen.  Psychiatric:        Mood and Affect: Mood is not anxious or depressed.        Speech: Speech normal.        Behavior: Behavior normal. Behavior is cooperative.        Thought Content: Thought content normal.        Cognition and Memory: Memory is not impaired. She does not  exhibit impaired recent memory or impaired remote memory.        Judgment: Judgment normal.      Assessment and Plan    Dizziness and giddiness Most consistent with BPPV. Can use meclizine prn. Given home desensitization exercise.Ppt to call if notimproving in  2 weeks for referralt to ENT or for balance retraining.  No red flags.  GERD (gastroesophageal reflux disease) Refilled PPI.  Atypical chest pain Most likely due to GERD.Marland Kitchen chronic issue for patient and has had eval in past.    Eliezer Lofts, MD

## 2019-07-02 NOTE — Assessment & Plan Note (Signed)
Most likely due to GERD.Marland Kitchen chronic issue for patient and has had eval in past.

## 2019-07-02 NOTE — Assessment & Plan Note (Signed)
Refilled PPI 

## 2019-07-02 NOTE — Assessment & Plan Note (Signed)
Most consistent with BPPV. Can use meclizine prn. Given home desensitization exercise.Ppt to call if notimproving in  2 weeks for referralt to ENT or for balance retraining.  No red flags.

## 2019-07-08 ENCOUNTER — Other Ambulatory Visit: Payer: Self-pay | Admitting: Medical

## 2019-07-31 ENCOUNTER — Telehealth: Payer: BC Managed Care – PPO | Admitting: Family Medicine

## 2019-07-31 ENCOUNTER — Ambulatory Visit: Payer: Self-pay | Admitting: *Deleted

## 2019-07-31 ENCOUNTER — Other Ambulatory Visit: Payer: Self-pay

## 2019-07-31 ENCOUNTER — Ambulatory Visit
Admission: EM | Admit: 2019-07-31 | Discharge: 2019-07-31 | Disposition: A | Discharge status: Home / Self Care | Payer: BC Managed Care – PPO | Attending: Emergency Medicine | Admitting: Emergency Medicine

## 2019-07-31 DIAGNOSIS — R030 Elevated blood-pressure reading, without diagnosis of hypertension: Secondary | ICD-10-CM

## 2019-07-31 DIAGNOSIS — J0100 Acute maxillary sinusitis, unspecified: Secondary | ICD-10-CM

## 2019-07-31 MED ORDER — AMOXICILLIN 875 MG PO TABS: 875.0000 mg | ORAL_TABLET | Freq: Two times a day (BID) | ORAL | 0 refills | Status: AC

## 2019-07-31 NOTE — Telephone Encounter (Signed)
mychart appointment 4/6 Pt will call back to cancel if she decides to go to urgent care

## 2019-07-31 NOTE — ED Provider Notes (Signed)
[facility]    CSN: [id number] Arrival date & time: 07/31/19  1111      History   Chief Complaint Chief Complaint  Patient presents with  . Sinus Problem    HPI Sharon Chaney is a 54 y.o. female.   Patient presents with sinus congestion, sinus pressure, "clogged" feeling in ears, facial pain over her sinuses x 1 month; worse x 1 week.  She denies fever, chills, rash, sore throat, cough, shortness of breath, vomiting, diarrhea, or other symptoms.  Treatment attempted at home with OTC sinus medication.    The history is provided by the patient.    Past Medical History:  Diagnosis Date  . Complication of anesthesia   . Endometriosis   . Pneumonia   . PONV (postoperative nausea and vomiting)     Patient Active Problem List   Diagnosis Date Noted  . Lower extremity pain 06/27/2018  . Cough 04/15/2018  . ETD (Eustachian tube dysfunction), bilateral 07/28/2017  . Decreased vision of right eye 07/28/2017  . Diarrhea of presumed infectious origin 04/15/2017  . RUQ pain 12/10/2016  . Acute epigastric pain 10/19/2016  . Dysfunctional uterine bleeding 05/18/2016  . Benign cyst of left breast 05/18/2016  . Viral URI 03/26/2016  . Dizziness and giddiness 08/27/2015  . Migraine with aura 08/27/2015  . Breast pain, left 08/27/2015  . Chronic fatigue 08/27/2015  . Atypical chest pain 08/27/2015  . Mild intermittent asthma in adult without complication 0000000  . Endometriosis 08/01/2014  . Chronic pelvic pain in female 08/01/2014  . GERD (gastroesophageal reflux disease) 08/01/2014  . DYSPNEA 11/07/2009  . MENORRHAGIA 02/22/2008  . STRESS INCONTINENCE 02/22/2008    Past Surgical History:  Procedure Laterality Date  . APPENDECTOMY    . CESAREAN SECTION    . LAPAROTOMY    . TONSILLECTOMY      OB History    Gravida  2   Para  2   Term  2   Preterm      AB      Living  2     SAB      TAB      Ectopic      Multiple      Live Births                 Home Medications    Prior to Admission medications   Medication Sig Start Date End Date Taking? Authorizing Provider  acetaminophen (TYLENOL) 500 MG tablet Take 2 tablets (1,000 mg total) by mouth every 6 (six) hours as needed. 04/27/15   [name], [name], PA-C  amoxicillin (AMOXIL) 875 MG tablet Take 1 tablet (875 mg total) by mouth 2 (two) times daily for 7 days. 07/31/19 08/07/19  Sharion Balloon, NP  CALCIUM PO Take 1 tablet by mouth in the morning and at bedtime.    [provider]  cholecalciferol (VITAMIN D) 1000 units tablet Take 1,000 Units by mouth daily.    [provider]  fluticasone (FLONASE) 50 MCG/ACT nasal spray SPRAY 2 SPRAYS INTO EACH NOSTRIL EVERY DAY 07/09/19   Saguier, Percell Miller, PA-C  loratadine (CLARITIN) 10 MG tablet Take 10 mg by mouth daily.    [provider]  omeprazole (PRILOSEC) 20 MG capsule Take 1 capsule (20 mg total) by mouth daily. 06/29/19   Jinny Sanders, MD    Family History Family History  Problem Relation Age of Onset  . Colon cancer Father 46  . Heart disease Father   .  Heart disease Paternal Grandmother   . Diabetes Paternal Grandmother   . Stroke Paternal Grandfather   . Arthritis Maternal Grandmother   . Stroke Mother   . Anesthesia problems Neg Hx   . Hypotension Neg Hx   . Malignant hyperthermia Neg Hx     Social History Social History   Tobacco Use  . Smoking status: Never Smoker  . Smokeless tobacco: Never Used  Substance Use Topics  . Alcohol use: No  . Drug use: No     Allergies   Promethazine and Latex   Review of Systems Review of Systems  Constitutional: Negative for chills and fever.  HENT: Positive for congestion, ear pain, postnasal drip, rhinorrhea and sinus pressure. Negative for sore throat and trouble swallowing.   Eyes: Negative for pain and visual disturbance.  Respiratory: Negative for cough and shortness of breath.   Cardiovascular: Negative for chest pain and palpitations.   Gastrointestinal: Negative for abdominal pain, diarrhea, nausea and vomiting.  Genitourinary: Negative for dysuria and hematuria.  Musculoskeletal: Negative for arthralgias and back pain.  Skin: Negative for color change and rash.  Neurological: Negative for seizures and syncope.  All other systems reviewed and are negative.    Physical Exam Triage Vital Signs ED Triage Vitals  Enc Vitals Group     BP      Pulse      Resp      Temp      Temp src      SpO2      Weight      Height      Head Circumference      Peak Flow      Pain Score      Pain Loc      Pain Edu?      Excl. in [place]?    No data found.  Updated Vital Signs BP (!) 154/92 (BP Location: Left Arm)   Pulse 75   Temp 98.1 F (36.7 C) (Oral)   Resp 18   Wt 213 lb (96.6 kg)   SpO2 98%   BMI 36.28 kg/m   Visual Acuity Right Eye Distance:   Left Eye Distance:   Bilateral Distance:    Right Eye Near:   Left Eye Near:    Bilateral Near:     Physical Exam Vitals and nursing note reviewed.  Constitutional:      General: She is not in acute distress.    Appearance: She is well-developed.  HENT:     Head: Normocephalic and atraumatic.     Right Ear: A middle ear effusion is present. Tympanic membrane is not erythematous.     Left Ear: Tympanic membrane normal. Tympanic membrane is not erythematous.     Nose: Congestion present.     Comments: Maxillary sinus tenderness, R>L    Mouth/Throat:     Mouth: Mucous membranes are moist.     Pharynx: Oropharynx is clear.  Eyes:     Conjunctiva/sclera: Conjunctivae normal.  Cardiovascular:     Rate and Rhythm: Normal rate and regular rhythm.     Heart sounds: No murmur.  Pulmonary:     Effort: Pulmonary effort is normal. No respiratory distress.     Breath sounds: Normal breath sounds.  Abdominal:     General: Bowel sounds are normal.     Palpations: Abdomen is soft.     Tenderness: There is no abdominal tenderness. There is no guarding or rebound.   Musculoskeletal:     Cervical  back: Neck supple.  Skin:    General: Skin is warm and dry.     Findings: No rash.  Neurological:     General: No focal deficit present.     Mental Status: She is alert and oriented to person, place, and time.  Psychiatric:        Mood and Affect: Mood normal.        Behavior: Behavior normal.      UC Treatments / Results  Labs (all labs ordered are listed, but only abnormal results are displayed) Labs Reviewed  NOVEL CORONAVIRUS, NAA    EKG   Radiology No results found.  Procedures Procedures (including critical care time)  Medications Ordered in UC Medications - No data to display  Initial Impression / Assessment and Plan / UC Course  I have reviewed the triage vital signs and the nursing notes.  Pertinent labs & imaging results that were available during my care of the patient were reviewed by me and considered in my medical decision making (see chart for details).   Sinusitis. Elevated blood pressure reading.  Due to length of symptoms, treating with amoxicillin.  Also instructed patient to take plain OTC Mucinex.  Discussed with patient that her blood pressure is elevated today and needs to be rechecked by her PCP in 2 to 4 weeks.  COVID test performed here.  Instructed patient to self quarantine until the test result is back.  Discussed with patient that she can take Tylenol as needed for fever or discomfort.  Instructed patient to go to the emergency department if she develops high fever, shortness of breath, severe diarrhea, or other concerning symptoms.  Patient agrees with plan of care.     Final Clinical Impressions(s) / UC Diagnoses   Final diagnoses:  Acute non-recurrent maxillary sinusitis  Elevated blood pressure reading     Discharge Instructions     Take the amoxicillin and Mucinex as directed.  Schedule an appointment with an ENT if your symptoms are not improving.    Your blood pressure is elevated today at  154/92.  Please have this rechecked by your primary care provider in 2-4 weeks.      Your COVID test is pending.  You should self quarantine until the test result is back.        ED Prescriptions    Medication Sig Dispense Auth. Provider   amoxicillin (AMOXIL) 875 MG tablet Take 1 tablet (875 mg total) by mouth 2 (two) times daily for 7 days. 14 tablet Sharion Balloon, NP     PDMP not reviewed this encounter.   Sharion Balloon, NP 07/31/19 [phone]

## 2019-07-31 NOTE — ED Triage Notes (Addendum)
Pt is here with sinuses, congestion, ear feel clogged, nasal drainage. This started last week, states she has a hx of allergies, sinuses.

## 2019-07-31 NOTE — Telephone Encounter (Signed)
Persistent ear congestion.. likely not due to COVID.Marland Kitchen. but pt now with new changes including loss of taste and smell...very slight temperature eval.   I can see virtually ( make as mychart if possible) and may treat with antibiotics.Marland Kitchen if desires in person OV.. have her go to Urgent care or respiratory clinic tonight.

## 2019-07-31 NOTE — Telephone Encounter (Signed)
Patient called back this morning - states that she was advised by Triage Nurse to call the office to schedule an in office appt.  Pt does report that she has lost her taste and has very little smells -- feels related to her allergies/sinuses.  Pt is requesting an OV today.   Prefers in office. I advised that given her symptoms we will have to check with Dr Diona Browner prior to bringing her in office.   See message below.  Please advise, thanks.

## 2019-07-31 NOTE — Telephone Encounter (Addendum)
Patient called [facility] to discuss her symptoms of ear congestion. Patient was seen by her provider in early March for vertigo and ear congestion.  Patient has hx of vertigo, migraines and allergies. Patient main complaint is persistent ear congestion and associated pain at times and now with decrease hearing.  Patient denies fever, shortness of breath or cough. Patient denies hearing any wheezing with her breathing.   Patient did report temp of 99 last week.  Patient has occasional clear drainage from nose and in the mornings dark green drainage at the back of her throat. Patient reporting she still hsa the ability to taste and smell.  Due to the fact patient's ear congestion is not improving and she is now experiencing a decrease in her hearing, this RN advised patient to call her PCP to be seen today.  If PCP unable to see patient, then advised patient to go to Urgent Care.  Patient planning to call the After Care line for her PCP practice, which is  [facility].  Patient verbalized understanding of the need to have a provider to assess her ears, nose and throat for any signs of infection.     Answer Assessment - Initial Assessment Questions 1. LOCATION: "Which ear is involved?"       Both ears.  Saw MD in March.   2. SENSATION: "Describe how the ear feels."       Affecting hearing, sometimes have ear pain.  Feels kinda stopped up.  3. ONSET:  "When did the ear symptoms start?"       Congestion in ear started one week ago - getting worse.   4. PAIN: "Do you also have an earache?" If so, ask: "How bad is it?" (Scale 1-10; or mild, moderate, severe)     2 at present.  Yesterday at 8 p.m. felt like my head was going to explode.   5. CAUSE: "What do you think is causing the ear congestion?"      Not sure - used to have a lot of sinus infections.  Has history of migraines.   6. URI: "Do you have a runny nose or cough?"      Cough when I lay down due to acid reflux.   7. NASAL ALLERGIES:  "Are there symptoms of hay fever, such as sneezing or a clear nasal discharge?"     Sneezing some.  Nose is running at times.  If I blow my nose it is bloody.  The drainage in the back of my throat is green in the morning.   8. PREGNANCY: "Is there any chance you are pregnant?" "When was your last menstrual period?"  Answer Assessment - Initial Assessment Questions 1. LOCATION: "Which ear is involved?"     Both  2. ONSET: "When did the ear start hurting"      "Several weeks ago. I saw my provider in March and she said I had a bubble in my ear.  But did not prescribe any medication for me.  She did tell me to start using my flonase again, as I had stopped using it.  I have been taking generic Claritin on a regular basis, tylenol and ibuprofen along with the flonase.  I use the flonase twice a day."   3. SEVERITY: "How bad is the pain?"  (Scale 1-10; mild, moderate or severe)   - MILD (1-3): doesn't interfere with normal activities    - MODERATE (4-7): interferes with normal activities or awakens from sleep    -  SEVERE (8-10): excruciating pain, unable to do any normal activities      "right now the pain is a 2, but last night around 8 p.m. I felt as though my head was going to explode.   4. URI SYMPTOMS: " Do you have a runny nose or cough?"     "No cough, but occasionally have runny nose."   5. FEVER: "Do you have a fever?" If so, ask: "What is your temperature, how was it measured, and when did it start?"     No fever.   6. CAUSE: "Have you been swimming recently?", "How often do you use Q-TIPS?", "Have you had any recent air travel or scuba diving?"      7. OTHER SYMPTOMS: "Do you have any other symptoms?" (e.g., headache, stiff neck, dizziness, vomiting, runny nose, decreased hearing)     "I have history of vertigo, allergies and chronic migraines.  My vertigo is better.  My ear congestion is not getting better, but is getting worse and now is affecting my hearing."    Patient is reporting no  exposure to COVID-19 that she is aware of. Denies fever at present.  Is reporting low grade - 99 degrees last week.   8. PREGNANCY: "Is there any chance you are pregnant?" "When was your last menstrual period?"  Protocols used: EAR - CONGESTION-A-AH, EARACHE-A-AH

## 2019-07-31 NOTE — Discharge Instructions (Addendum)
Take the amoxicillin and Mucinex as directed.  Schedule an appointment with an ENT if your symptoms are not improving.    Your blood pressure is elevated today at 154/92.  Please have this rechecked by your primary care provider in 2-4 weeks.      Your COVID test is pending.  You should self quarantine until the test result is back.

## 2019-08-01 LAB — SARS-COV-2, NAA 2 DAY TAT

## 2019-08-01 LAB — NOVEL CORONAVIRUS, NAA: SARS-CoV-2, NAA: DETECTED — AB

## 2019-08-02 ENCOUNTER — Telehealth: Payer: Self-pay | Admitting: Physician Assistant

## 2019-08-02 ENCOUNTER — Encounter: Payer: Self-pay | Admitting: Physician Assistant

## 2019-08-02 NOTE — Telephone Encounter (Signed)
Called to discuss with [employer] about Covid symptoms and the use of bamlanivimab/etesevimab or casirivimab/imdevimab, a monoclonal antibody infusion for those with mild to moderate Covid symptoms and at a high risk of hospitalization.     Pt is qualified for this infusion at the [facility] infusion center due to co-morbid conditions and/or a member of an at-risk group, however declines infusion at this time. Symptoms tier reviewed as well as criteria for ending isolation.  Symptoms reviewed that would warrant ED/Hospital evaluation. Preventative practices reviewed. Patient verbalized understanding. Patient advised to call back if he decides that he does want to get infusion. Callback number to the infusion center given. Patient advised to go to Urgent care or ED with severe symptoms. Last date pt would be eligible for infusion is 08/03/19.    Patient Active Problem List   Diagnosis Date Noted  . Morbid obesity ([place])   . Lower extremity pain 06/27/2018  . Cough 04/15/2018  . ETD (Eustachian tube dysfunction), bilateral 07/28/2017  . Decreased vision of right eye 07/28/2017  . Diarrhea of presumed infectious origin 04/15/2017  . RUQ pain 12/10/2016  . Acute epigastric pain 10/19/2016  . Dysfunctional uterine bleeding 05/18/2016  . Benign cyst of left breast 05/18/2016  . Viral URI 03/26/2016  . Dizziness and giddiness 08/27/2015  . Migraine with aura 08/27/2015  . Breast pain, left 08/27/2015  . Chronic fatigue 08/27/2015  . Atypical chest pain 08/27/2015  . Mild intermittent asthma in adult without complication 0000000  . Endometriosis 08/01/2014  . Chronic pelvic pain in female 08/01/2014  . GERD (gastroesophageal reflux disease) 08/01/2014  . DYSPNEA 11/07/2009  . MENORRHAGIA 02/22/2008  . STRESS INCONTINENCE 02/22/2008    Angelena Form PA-C

## 2019-08-07 ENCOUNTER — Ambulatory Visit: Payer: BC Managed Care – PPO | Admitting: Family Medicine

## 2019-09-14 ENCOUNTER — Other Ambulatory Visit: Payer: Self-pay

## 2019-09-14 ENCOUNTER — Encounter: Payer: Self-pay | Admitting: Internal Medicine

## 2019-09-14 ENCOUNTER — Ambulatory Visit (INDEPENDENT_AMBULATORY_CARE_PROVIDER_SITE_OTHER)
Admission: RE | Admit: 2019-09-14 | Discharge: 2019-09-14 | Disposition: A | Discharge status: Home / Self Care | Payer: BC Managed Care – PPO | Source: Ambulatory Visit | Attending: Internal Medicine | Admitting: Internal Medicine

## 2019-09-14 ENCOUNTER — Ambulatory Visit: Payer: BC Managed Care – PPO | Admitting: Internal Medicine

## 2019-09-14 VITALS — BP 136/84 | HR 100 | Temp 97.7°F | Wt 215.0 lb

## 2019-09-14 DIAGNOSIS — G8929 Other chronic pain: Secondary | ICD-10-CM | POA: Diagnosis not present

## 2019-09-14 DIAGNOSIS — M5441 Lumbago with sciatica, right side: Secondary | ICD-10-CM

## 2019-09-14 MED ORDER — METHOCARBAMOL 500 MG PO TABS: 500.0000 mg | ORAL_TABLET | Freq: Three times a day (TID) | ORAL | 0 refills | Status: DC | PRN

## 2019-09-14 MED ORDER — IBUPROFEN 600 MG PO TABS: 600.0000 mg | ORAL_TABLET | Freq: Two times a day (BID) | ORAL | 0 refills | Status: DC | PRN

## 2019-09-14 NOTE — Patient Instructions (Signed)

## 2019-09-14 NOTE — Progress Notes (Signed)
Subjective:    Patient ID: Sharon Chaney, female    DOB: [birth year], 54 y.o.   MRN: [id number]  HPI  Pt presents to the clinic today with c/o right lower back and hip pain. This started yesterday after working out in the garden the day before. She describes the pain as sharp, shooting and throbbing. The pain radiates into her right lower leg and up her back. She reports associated numbness and tingling but denies weakness. She denies loss of bowel or bladder control. She has tried Ibuprofen and heat with minimal relief.  Review of Systems      Past Medical History:  Diagnosis Date  . Complication of anesthesia   . Endometriosis   . Morbid obesity ([place])   . Pneumonia   . PONV (postoperative nausea and vomiting)     Current Outpatient Medications  Medication Sig Dispense Refill  . acetaminophen (TYLENOL) 500 MG tablet Take 2 tablets (1,000 mg total) by mouth every 6 (six) hours as needed. 30 tablet 0  . CALCIUM PO Take 1 tablet by mouth in the morning and at bedtime.    . cholecalciferol (VITAMIN D) 1000 units tablet Take 1,000 Units by mouth daily.    . fluticasone (FLONASE) 50 MCG/ACT nasal spray SPRAY 2 SPRAYS INTO EACH NOSTRIL EVERY DAY 16 mL 1  . loratadine (CLARITIN) 10 MG tablet Take 10 mg by mouth daily.    Marland Kitchen omeprazole (PRILOSEC) 20 MG capsule Take 1 capsule (20 mg total) by mouth daily. 30 capsule 11   No current facility-administered medications for this visit.    Allergies  Allergen Reactions  . Promethazine Other (See Comments)    intolerant  . Latex Rash    Family History  Problem Relation Age of Onset  . Colon cancer Father 81  . Heart disease Father   . Heart disease Paternal Grandmother   . Diabetes Paternal Grandmother   . Stroke Paternal Grandfather   . Arthritis Maternal Grandmother   . Stroke Mother   . Anesthesia problems Neg Hx   . Hypotension Neg Hx   . Malignant hyperthermia Neg Hx     Social History   Socioeconomic History  .  Marital status: Married    Spouse name: Not on file  . Number of children: Not on file  . Years of education: Not on file  . Highest education level: Not on file  Occupational History  . Not on file  Tobacco Use  . Smoking status: Never Smoker  . Smokeless tobacco: Never Used  Substance and Sexual Activity  . Alcohol use: No  . Drug use: No  . Sexual activity: Yes    Birth control/protection: None    Comment: Married  Other Topics Concern  . Not on file  Social History Narrative  . Not on file   Social Determinants of Health   Financial Resource Strain:   . Difficulty of Paying Living Expenses:   Food Insecurity:   . Worried About Charity fundraiser in the Last Year:   . Arboriculturist in the Last Year:   Transportation Needs:   . Film/video editor (Medical):   Marland Kitchen Lack of Transportation (Non-Medical):   Physical Activity:   . Days of Exercise per Week:   . Minutes of Exercise per Session:   Stress:   . Feeling of Stress :   Social Connections:   . Frequency of Communication with Friends and Family:   . Frequency of Social Gatherings  with Friends and Family:   . Attends Religious Services:   . Active Member of Clubs or Organizations:   . Attends Archivist Meetings:   Marland Kitchen Marital Status:   Intimate Partner Violence:   . Fear of Current or Ex-Partner:   . Emotionally Abused:   Marland Kitchen Physically Abused:   . Sexually Abused:      Constitutional: Denies fever, malaise, fatigue, headache or abrupt weight changes.  Respiratory: Denies difficulty breathing, shortness of breath, cough or sputum production.   Cardiovascular: Denies chest pain, chest tightness, palpitations or swelling in the hands or feet.  Musculoskeletal: Pt reports back and hip pain. Denies decrease in range of motion, difficulty with gait, muscle pain or joint swelling.  Skin: Denies redness, rashes, lesions or ulcercations.  Neurological: Pt reports numbness and tingling in the right leg.  Denies problems with balance and coordination.    No other specific complaints in a complete review of systems (except as listed in HPI above).  Objective:   Physical Exam BP 136/84   Pulse 100   Temp 97.7 F (36.5 C) (Temporal)   Wt 215 lb (97.5 kg)   SpO2 98%   BMI 36.62 kg/m   Wt Readings from Last 3 Encounters:  07/31/19 213 lb (96.6 kg)  06/29/19 213 lb 4 oz (96.7 kg)  03/17/19 210 lb (95.3 kg)    General: Appears her stated age, obese, in NAD. Skin: Warm, dry and intact. No rashes noted. Cardiovascular: Normal rate. Pulmonary/Chest: Normal effort.  Musculoskeletal: Decreased extension, rotation to the left and lateral bending to the left. Normal flexion, rotation to the right and lateral bending to the right. Bony tenderness noted over the lumbar spine. Pain with palpation over the right SI joint. No pain with palpation of the right hip. Strength 5/5 BLE. Can stand on heels and toes. Gait slow and steady without device. Neurological: Alert and oriented. Negative SLR on the right.    BMET    Component Value Date/Time   NA 138 06/27/2018 1159   K 4.2 06/27/2018 1159   CL 104 06/27/2018 1159   CO2 26 06/27/2018 1159   GLUCOSE 91 06/27/2018 1159   BUN 12 06/27/2018 1159   CREATININE 0.77 06/27/2018 1159   CALCIUM 9.4 06/27/2018 1159   GFRNONAA 84 03/15/2008 1012   GFRAA 102 03/15/2008 1012    Lipid Panel     Component Value Date/Time   CHOL 224 (H) 08/27/2015 1132   TRIG 113.0 08/27/2015 1132   HDL 46.30 08/27/2015 1132   CHOLHDL 5 08/27/2015 1132   VLDL 22.6 08/27/2015 1132   LDLCALC 155 (H) 08/27/2015 1132    CBC    Component Value Date/Time   WBC 6.7 06/27/2018 1159   RBC 4.90 06/27/2018 1159   HGB 13.2 06/27/2018 1159   HCT 40.4 06/27/2018 1159   PLT 325.0 06/27/2018 1159   MCV 82.5 06/27/2018 1159   MCHC 32.8 06/27/2018 1159   RDW 16.9 (H) 06/27/2018 1159   LYMPHSABS 2.1 10/19/2016 1142   MONOABS 0.5 10/19/2016 1142   EOSABS 0.1 10/19/2016  1142   BASOSABS 0.1 10/19/2016 1142    Hgb A1C No results found for: HGBA1C         Assessment & Plan:   Chronic Right Sided Low Back Pain with Right Sided Sciatica:  Xray lumbar spine today RX for Ibuprofen 600 mg BID prn RX for Methocarbamol 500 mg PO QHS prn- sedation caution given Heat, ice and massage may be helpful Stretching  exercises given  Will follow up after xray, return precautions discussed Webb Silversmith, NP This visit occurred during the SARS-CoV-2 public health emergency.  Safety protocols were in place, including screening questions prior to the visit, additional usage of staff PPE, and extensive cleaning of exam room while observing appropriate contact time as indicated for disinfecting solutions.

## 2019-12-26 ENCOUNTER — Other Ambulatory Visit: Payer: Self-pay

## 2019-12-26 ENCOUNTER — Ambulatory Visit (INDEPENDENT_AMBULATORY_CARE_PROVIDER_SITE_OTHER)
Admission: RE | Admit: 2019-12-26 | Discharge: 2019-12-26 | Disposition: A | Discharge status: Home / Self Care | Payer: BC Managed Care – PPO | Source: Ambulatory Visit | Attending: Family Medicine | Admitting: Family Medicine

## 2019-12-26 ENCOUNTER — Ambulatory Visit: Payer: BC Managed Care – PPO | Admitting: Family Medicine

## 2019-12-26 ENCOUNTER — Encounter: Payer: Self-pay | Admitting: Family Medicine

## 2019-12-26 VITALS — BP 116/72 | HR 80 | Temp 96.8°F | Ht 64.25 in | Wt 216.0 lb

## 2019-12-26 DIAGNOSIS — G8929 Other chronic pain: Secondary | ICD-10-CM

## 2019-12-26 DIAGNOSIS — M542 Cervicalgia: Secondary | ICD-10-CM | POA: Diagnosis not present

## 2019-12-26 DIAGNOSIS — M25512 Pain in left shoulder: Secondary | ICD-10-CM | POA: Diagnosis not present

## 2019-12-26 DIAGNOSIS — R0789 Other chest pain: Secondary | ICD-10-CM

## 2019-12-26 NOTE — Assessment & Plan Note (Signed)
Worse with L rotation and flex  Also pain in shoulder/shoulder blade and cw Xray of cs ordered  ? If some radiculopathy or deg change

## 2019-12-26 NOTE — Progress Notes (Signed)
Subjective:    Patient ID: Sharon Chaney, female    DOB: February 03, 1966, 53 y.o.   MRN: [id number]  This visit occurred during the SARS-CoV-2 public health emergency.  Safety protocols were in place, including screening questions prior to the visit, additional usage of staff PPE, and extensive cleaning of exam room while observing appropriate contact time as indicated for disinfecting solutions.    HPI Pt presents with L upper back/shoulder area pain   54 yo pt of Dr Welton Flakes Readings from Last 3 Encounters:  12/26/19 216 lb (98 kg)  09/14/19 215 lb (97.5 kg)  07/31/19 213 lb (96.6 kg)   36.79 kg/m  3 weeks ago noticed L arm pain -worse than usual  Shoulder blade to chest/shoulder and neck also   No injury or new activity  No swelling   Ribs /anterior are sore to the touch  Pain moves around  Sometimes sharp pain/pinching Other times dull or throbbing   Arm feels weak at times (L) No numbness or tingling   Pain does interfere with sleep /lying down   Sitting will increase pain at times   Holding phone with L hand make it worse  Also neck movement  Her neck and arm both pop  Worse to work overhead   Is able to hook her bra   occ takes tylenol or ibuprofen  No muscle relaxer     Review of Systems  Constitutional: Negative for activity change, appetite change, fatigue, fever and unexpected weight change.  HENT: Negative for congestion, ear pain, rhinorrhea, sinus pressure and sore throat.   Eyes: Negative for pain, redness and visual disturbance.  Respiratory: Negative for cough, shortness of breath and wheezing.   Cardiovascular: Negative for chest pain and palpitations.  Gastrointestinal: Negative for abdominal pain, blood in stool, constipation and diarrhea.  Endocrine: Negative for polydipsia and polyuria.  Genitourinary: Positive for menstrual problem. Negative for dysuria, frequency and urgency.       Perimenopausal bleeding   Musculoskeletal:  Positive for arthralgias, back pain, myalgias and neck pain. Negative for joint swelling.       Widespread pain  Worse with menopause  Skin: Negative for pallor and rash.  Allergic/Immunologic: Negative for environmental allergies.  Neurological: Negative for dizziness, syncope, weakness, light-headedness, numbness and headaches.  Hematological: Negative for adenopathy. Does not bruise/bleed easily.  Psychiatric/Behavioral: Negative for decreased concentration and dysphoric mood. The patient is not nervous/anxious.        Objective:   Physical Exam Constitutional:      General: She is not in acute distress.    Appearance: She is well-developed. She is obese. She is not ill-appearing or diaphoretic.  HENT:     Head: Normocephalic and atraumatic.  Eyes:     General: No scleral icterus.    Pupils: Pupils are equal, round, and reactive to light.  Neck:     Vascular: No carotid bruit.  Cardiovascular:     Rate and Rhythm: Normal rate and regular rhythm.     Pulses: Normal pulses.     Heart sounds: Normal heart sounds.  Pulmonary:     Effort: Pulmonary effort is normal. No respiratory distress.     Breath sounds: Normal breath sounds. No wheezing or rales.  Musculoskeletal:     Right shoulder: Normal.     Left shoulder: Tenderness present. No swelling, deformity, effusion or crepitus. Decreased range of motion. Normal strength. Normal pulse.     Left upper arm: No edema, deformity or bony  tenderness.     Left elbow: Normal.     Cervical back: Normal range of motion. Spasms, tenderness, bony tenderness and crepitus present. No swelling, deformity, erythema, signs of trauma, rigidity or torticollis. Pain with movement present. Normal range of motion.     Right lower leg: No edema.     Left lower leg: No edema.     Comments: L shoulder -mildly pos Hawking/Neer tests  Pain with abduction over 90 deg and internal rotation  Tender over acromion and biceps tendon  CS-tender over lower  cervical spines  Pain to rotate L and fully flex CS Some L sided cervical muscular tenderness incl trapezius  Mild tenderness in L thoracic musculature  Tender over L anterior ribs (no step off or crepitus)   Lymphadenopathy:     Cervical: No cervical adenopathy.  Skin:    General: Skin is warm and dry.     Findings: No erythema or rash.  Neurological:     Mental Status: She is alert.     Cranial Nerves: No cranial nerve deficit.     Sensory: No sensory deficit.     Motor: No weakness.     Coordination: Coordination normal.     Deep Tendon Reflexes: Reflexes normal.  Psychiatric:        Mood and Affect: Mood normal.           Assessment & Plan:   Problem List Items Addressed This Visit      Other   Left shoulder pain - Primary    Exam consistent with tendonitis but also has neck and cw pain  Xray ordered  Taking tylenol/nsaid  Given shoulder passive rom exercise handout      Relevant Orders   DG Shoulder Left   Neck pain    Worse with L rotation and flex  Also pain in shoulder/shoulder blade and cw Xray of cs ordered  ? If some radiculopathy or deg change      Relevant Orders   DG Cervical Spine Complete   Chest wall pain    Tender over L anterior ribs  Rib and chest films ordered  Recommend warm compress  ? If related to other pain on that side of body and shoulder girdle       Relevant Orders   DG Chest 2 View   DG Ribs Unilateral Left

## 2019-12-26 NOTE — Assessment & Plan Note (Signed)
Exam consistent with tendonitis but also has neck and cw pain  Xray ordered  Taking tylenol/nsaid  Given shoulder passive rom exercise handout

## 2019-12-26 NOTE — Assessment & Plan Note (Signed)
Tender over L anterior ribs  Rib and chest films ordered  Recommend warm compress  ? If related to other pain on that side of body and shoulder girdle

## 2019-12-26 NOTE — Patient Instructions (Addendum)
Use heat on sore areas  Try the shoulder range of motion exercises to prevent stiffness   Tylenol Ibuprofen  Both ok for pain   Take your muscle relaxer if needed   xrays now  We will call with results and plan

## 2019-12-28 DIAGNOSIS — R9389 Abnormal findings on diagnostic imaging of other specified body structures: Secondary | ICD-10-CM

## 2020-01-03 ENCOUNTER — Ambulatory Visit: Payer: BC Managed Care – PPO | Admitting: Family Medicine

## 2020-01-03 ENCOUNTER — Other Ambulatory Visit: Payer: Self-pay

## 2020-01-03 ENCOUNTER — Encounter: Payer: Self-pay | Admitting: Family Medicine

## 2020-01-03 ENCOUNTER — Ambulatory Visit (HOSPITAL_COMMUNITY): Payer: BC Managed Care – PPO | Attending: Cardiology

## 2020-01-03 VITALS — BP 112/72 | HR 91 | Temp 98.1°F | Ht 64.25 in | Wt 214.8 lb

## 2020-01-03 DIAGNOSIS — M542 Cervicalgia: Secondary | ICD-10-CM | POA: Diagnosis not present

## 2020-01-03 DIAGNOSIS — M25512 Pain in left shoulder: Secondary | ICD-10-CM

## 2020-01-03 DIAGNOSIS — M436 Torticollis: Secondary | ICD-10-CM

## 2020-01-03 DIAGNOSIS — G8929 Other chronic pain: Secondary | ICD-10-CM | POA: Diagnosis not present

## 2020-01-03 DIAGNOSIS — R9389 Abnormal findings on diagnostic imaging of other specified body structures: Secondary | ICD-10-CM | POA: Diagnosis not present

## 2020-01-03 LAB — ECHOCARDIOGRAM COMPLETE
Area-P 1/2: 5.66 cm2
Height: 64.25 in
S' Lateral: 2.9 cm
Weight: 3436 oz

## 2020-01-03 MED ORDER — MELOXICAM 15 MG PO TABS
15.0000 mg | ORAL_TABLET | Freq: Every day | ORAL | 2 refills | Status: DC
Start: 2020-01-03 — End: 2020-01-03

## 2020-01-03 NOTE — Progress Notes (Signed)
Sharon Chaney T. Sharon Rodger, MD, [place] at [facility] [name], [address]  Phone: [phone]  FAX: [place] - 54 y.o. female  MRN [id number]  Date of Birth: 1966-01-07  Date: 01/03/2020  PCP: Jinny Sanders, MD  Referral: Jinny Sanders, MD  Chief Complaint  Patient presents with  . Follow-up    Neck/Left Shoulder Pain    This visit occurred during the SARS-CoV-2 public health emergency.  Safety protocols were in place, including screening questions prior to the visit, additional usage of staff PPE, and extensive cleaning of exam room while observing appropriate contact time as indicated for disinfecting solutions.   Subjective:   Sharon Chaney is a 54 y.o. very pleasant female patient with Body mass index is 36.58 kg/m. who presents with the following:  The patient presents with some ongoing shoulder as well as some cervical spine pain.  This is a new consultation to me for evaluation of these conditions secondary to my partner Dr. Glori Bickers.  Has been ongoing for six years, now worse.  Upper back and across her chest and l shoulder.  Brushing her teeth and her neck was really hurting it.  Sometimes cannot use her left arm or shoulder.  She has muscle spasm across her neck as well as her upper middle trap region.  She also has some terminal motion pain with the shoulder as well as the neck.  She does not describe any discrete new injury that is happened, but she has had some accidents in the past.  Everyday activities hurt and to wash dishes.  Sweeping and racking bother hher a lot.  Truck will hurt.  Hurts all the time.   Has done some nsaids over the year.  Rare use of over-the-counter ibuprofen.  spurlings pos on the l  She declined all of my recommendations  Review of Systems is noted in the HPI, as appropriate   Objective:   BP 112/72   Pulse 91    Temp 98.1 F (36.7 C) (Temporal)   Ht 5' 4.25" (1.632 m)   Wt 214 lb 12 oz (97.4 kg)   SpO2 98%   BMI 36.58 kg/m    GEN: Well-developed,well-nourished,in no acute distress; alert,appropriate and cooperative throughout examination HEENT: Normocephalic and atraumatic without obvious abnormalities. Ears, externally no deformities PULM: Breathing comfortably in no respiratory distress EXT: No clubbing, cyanosis, or edema PSYCH: Normally interactive. Cooperative during the interview. Pleasant. Friendly and conversant. Not anxious or depressed appearing. Normal, full affect.  CERVICAL SPINE EXAM Range of motion: Flexion, extension, lateral bending, and rotation: Global loss of approximately 30% normal motion Pain with terminal motion: Yes Spinous Processes: NT SCM: NT Upper paracervical muscles: Tender to palpation bilateral Upper traps: Left-sided tender to palpation C5-T1 intact, sensation and motor  He does have an inducible Spurling's maneuver on the left, self inducible.  Left shoulder: Notable loss of motion in all directions, predominantly in abduction and flexion with a maximum range of motion of 160 degrees. Strength is 5/5 in all directions.  Speeds and Yergason's are negative.  Minimal tenderness at the [facility] joint.  Minimal tenderness at the bicipital groove. Neer testing and Michel Bickers testing are positive, but the patient does have loss of motion which makes this more of limited utility  Radiology: DG Chest 2 View  Result Date: 12/27/2019 CLINICAL DATA:  pain in L anterior chest wall with  tenderness EXAM: CHEST - 2 VIEW COMPARISON:  Chest radiograph 02/16/2008 FINDINGS: Stable cardiomediastinal contours. There is a prominent right heart border likely corresponding to an enlarged right atrium similar to prior. The lungs are clear. No pneumothorax or pleural effusion. No acute finding in the visualized skeleton. IMPRESSION: No acute cardiopulmonary process. Prominent right  heart border similar to prior, likely an enlarged right atrium. Electronically Signed   By: Audie Pinto M.D.   On: 12/27/2019 08:45   DG Ribs Unilateral Left  Result Date: 12/27/2019 CLINICAL DATA:  pain in L anterior chest wall EXAM: LEFT RIBS - 2 VIEW COMPARISON:  None. FINDINGS: Skin BBs marking the site of pain reported by the patient along the left chest wall. No fracture or other bone lesions are seen involving the ribs. IMPRESSION: Negative. Electronically Signed   By: Audie Pinto M.D.   On: 12/27/2019 08:48   DG Cervical Spine Complete  Result Date: 12/27/2019 CLINICAL DATA:  pain in L side of neck radiating to left arm EXAM: CERVICAL SPINE - COMPLETE 4+ VIEW COMPARISON:  None. FINDINGS: The dens is not visualized on the open-mouth view due to overlying osseous structures. There is no evidence of cervical spine fracture or prevertebral soft tissue swelling. Alignment is normal. Mild to moderate degenerative changes at the C4-C5 levels with osteophyte formation and uncovertebral arthropathy. IMPRESSION: No acute fracture or traumatic listhesis of the cervical spine. Please note markedly limited evaluation of the dens on open-mouth view due to overlying osseous structures. Electronically Signed   By: Iven Finn M.D.   On: 12/27/2019 14:02   DG Shoulder Left  Result Date: 12/27/2019 CLINICAL DATA:  shoulder pain worse with internal rotation EXAM: LEFT SHOULDER - 2+ VIEW COMPARISON:  None. FINDINGS: There is no evidence of fracture or dislocation. There is no evidence of arthropathy or other focal bone abnormality. Soft tissues are unremarkable. IMPRESSION: Negative. Electronically Signed   By: Audie Pinto M.D.   On: 12/27/2019 08:48    Assessment and Plan:     ICD-10-CM   1. Chronic left shoulder pain  M25.512 Ambulatory referral to Physical Therapy   G89.29   2. Neck pain  M54.2 Ambulatory referral to Physical Therapy  3. Chronic neck pain  M54.2 Ambulatory referral to  Physical Therapy   G89.29   4. Neck stiffness  M43.6 Ambulatory referral to Physical Therapy   Total encounter time: 30 minutes. This includes total time spent on the day of encounter.  Additional time spent in independent review of radiology as well as discussion of this and plan of care with the patient.  Minor number of suggestions including a different class of NSAIDs, steroids, and some others which she declined.  I do think some physical therapy with range of motion to help restore her neck motion as well as her shoulder motion would be quite helpful, and I think this is driving much of her discomfort.  Follow-up: No follow-ups on file.  Meds ordered this encounter  Medications  . DISCONTD: meloxicam (MOBIC) 15 MG tablet    Sig: Take 1 tablet (15 mg total) by mouth daily.    Dispense:  30 tablet    Refill:  2   Medications Discontinued During This Encounter  Medication Reason  . meloxicam (MOBIC) 15 MG tablet    Orders Placed This Encounter  Procedures  . Ambulatory referral to Physical Therapy    Signed,  Frederico Hamman T. Kimberlly Norgard, MD   Outpatient Encounter Medications as of 01/03/2020  Medication  Sig  . acetaminophen (TYLENOL) 500 MG tablet Take 2 tablets (1,000 mg total) by mouth every 6 (six) hours as needed.  Marland Kitchen CALCIUM PO Take 1 tablet by mouth in the morning and at bedtime.  . cholecalciferol (VITAMIN D) 1000 units tablet Take 1,000 Units by mouth daily.  . COLLAGEN PO Take 1 Scoop by mouth daily.  . fluticasone (FLONASE) 50 MCG/ACT nasal spray SPRAY 2 SPRAYS INTO EACH NOSTRIL EVERY DAY  . ibuprofen (ADVIL) 600 MG tablet Take 1 tablet (600 mg total) by mouth 2 (two) times daily as needed.  . loratadine (CLARITIN) 10 MG tablet Take 10 mg by mouth daily.  . methocarbamol (ROBAXIN) 500 MG tablet Take 1 tablet (500 mg total) by mouth every 8 (eight) hours as needed for muscle spasms.  . Misc Natural Products (JOINT HEALTH PO) Take by mouth.  . Multiple Vitamin  (MULTIVITAMIN) tablet Take 1 tablet by mouth daily.  . Multiple Vitamins-Minerals (EYE VITAMINS PO) Take by mouth.  Marland Kitchen omeprazole (PRILOSEC) 20 MG capsule Take 1 capsule (20 mg total) by mouth daily.  . [DISCONTINUED] meloxicam (MOBIC) 15 MG tablet Take 1 tablet (15 mg total) by mouth daily.   No facility-administered encounter medications on file as of 01/03/2020.

## 2020-01-11 ENCOUNTER — Encounter: Payer: Self-pay | Admitting: *Deleted

## 2020-04-02 ENCOUNTER — Other Ambulatory Visit: Payer: Self-pay | Admitting: Family Medicine

## 2020-04-02 DIAGNOSIS — Z1231 Encounter for screening mammogram for malignant neoplasm of breast: Secondary | ICD-10-CM

## 2020-04-21 ENCOUNTER — Other Ambulatory Visit: Payer: BC Managed Care – PPO

## 2020-05-10 ENCOUNTER — Ambulatory Visit (INDEPENDENT_AMBULATORY_CARE_PROVIDER_SITE_OTHER): Payer: BC Managed Care – PPO

## 2020-05-10 ENCOUNTER — Other Ambulatory Visit: Payer: Self-pay

## 2020-05-10 ENCOUNTER — Ambulatory Visit (HOSPITAL_COMMUNITY)
Admission: EM | Admit: 2020-05-10 | Discharge: 2020-05-10 | Disposition: A | Discharge status: Home / Self Care | Payer: BC Managed Care – PPO | Attending: Family Medicine | Admitting: Family Medicine

## 2020-05-10 ENCOUNTER — Encounter (HOSPITAL_COMMUNITY): Payer: Self-pay | Admitting: Emergency Medicine

## 2020-05-10 DIAGNOSIS — R0781 Pleurodynia: Secondary | ICD-10-CM

## 2020-05-10 MED ORDER — HYDROCODONE-ACETAMINOPHEN 5-325 MG PO TABS: 1.0000 | ORAL_TABLET | Freq: Four times a day (QID) | ORAL | 0 refills | Status: DC | PRN

## 2020-05-10 MED ORDER — CYCLOBENZAPRINE HCL 5 MG PO TABS: 5.0000 mg | ORAL_TABLET | Freq: Three times a day (TID) | ORAL | 0 refills | Status: DC | PRN

## 2020-05-10 NOTE — ED Provider Notes (Signed)
[name]    CSN: [id number] Arrival date & time: 05/10/20  1649      History   Chief Complaint Chief Complaint  Patient presents with  . Back Pain    HPI Sharon Chaney is a 55 y.o. female.   Here today with left side and rib pain after falling from a ladder back into a hutch/dresser hitting this area earlier today. She did not hit head or sustain other injuries in the fall. No bruising, CP, SOB, dizziness since incident. Has not yet tried anything for pain.      Past Medical History:  Diagnosis Date  . Complication of anesthesia   . Endometriosis   . Morbid obesity ([place])   . Pneumonia   . PONV (postoperative nausea and vomiting)     Patient Active Problem List   Diagnosis Date Noted  . Left shoulder pain 12/26/2019  . Neck pain 12/26/2019  . Chest wall pain 12/26/2019  . Morbid obesity ([place])   . Lower extremity pain 06/27/2018  . Decreased vision of right eye 07/28/2017  . Migraine with aura 08/27/2015  . Mild intermittent asthma in adult without complication 76/22/6333  . Endometriosis 08/01/2014  . Chronic pelvic pain in female 08/01/2014  . GERD (gastroesophageal reflux disease) 08/01/2014  . MENORRHAGIA 02/22/2008  . STRESS INCONTINENCE 02/22/2008    Past Surgical History:  Procedure Laterality Date  . APPENDECTOMY    . CESAREAN SECTION    . LAPAROTOMY    . TONSILLECTOMY      OB History    Gravida  2   Para  2   Term  2   Preterm      AB      Living  2     SAB      IAB      Ectopic      Multiple      Live Births               Home Medications    Prior to Admission medications   Medication Sig Start Date End Date Taking? Authorizing Provider  cyclobenzaprine (FLEXERIL) 5 MG tablet Take 1 tablet (5 mg total) by mouth 3 (three) times daily as needed for muscle spasms. DO NOT DRINK ALCOHOL OR DRIVE WHILE TAKING THIS MEDICATION 05/10/20  Yes Volney American, PA-C  HYDROcodone-acetaminophen  (NORCO/VICODIN) 5-325 MG tablet Take 1 tablet by mouth every 6 (six) hours as needed for moderate pain. 05/10/20  Yes Volney American, PA-C  acetaminophen (TYLENOL) 500 MG tablet Take 2 tablets (1,000 mg total) by mouth every 6 (six) hours as needed. 04/27/15   [name], [name], PA-C  CALCIUM PO Take 1 tablet by mouth in the morning and at bedtime.    [provider]  cholecalciferol (VITAMIN D) 1000 units tablet Take 1,000 Units by mouth daily.    [provider]  COLLAGEN PO Take 1 Scoop by mouth daily.    [provider]  fluticasone (FLONASE) 50 MCG/ACT nasal spray SPRAY 2 SPRAYS INTO EACH NOSTRIL EVERY DAY 07/09/19   Saguier, Percell Miller, PA-C  ibuprofen (ADVIL) 600 MG tablet Take 1 tablet (600 mg total) by mouth 2 (two) times daily as needed. 09/14/19   Jearld Fenton, NP  loratadine (CLARITIN) 10 MG tablet Take 10 mg by mouth daily.    [provider]  methocarbamol (ROBAXIN) 500 MG tablet Take 1 tablet (500 mg total) by mouth every 8 (eight) hours as needed for muscle spasms.  09/14/19   Jearld Fenton, NP  Misc Natural Products (JOINT HEALTH PO) Take by mouth.    [provider]  Multiple Vitamin (MULTIVITAMIN) tablet Take 1 tablet by mouth daily.    [provider]  Multiple Vitamins-Minerals (EYE VITAMINS PO) Take by mouth.    [provider]  omeprazole (PRILOSEC) 20 MG capsule Take 1 capsule (20 mg total) by mouth daily. 06/29/19   Jinny Sanders, MD    Family History Family History  Problem Relation Age of Onset  . Colon cancer Father 39  . Heart disease Father   . Heart disease Paternal Grandmother   . Diabetes Paternal Grandmother   . Stroke Paternal Grandfather   . Arthritis Maternal Grandmother   . Stroke Mother   . Anesthesia problems Neg Hx   . Hypotension Neg Hx   . Malignant hyperthermia Neg Hx     Social History Social History   Tobacco Use  . Smoking status: Never Smoker  . Smokeless tobacco: Never Used   Substance Use Topics  . Alcohol use: No  . Drug use: No     Allergies   Promethazine and Latex   Review of Systems Review of Systems PER HPI   Physical Exam Triage Vital Signs ED Triage Vitals  Enc Vitals Group     BP 05/10/20 1758 (!) 155/92     Pulse Rate 05/10/20 1758 81     Resp 05/10/20 1758 16     Temp 05/10/20 1758 98.7 F (37.1 C)     Temp Source 05/10/20 1758 Oral     SpO2 05/10/20 1758 100 %     Weight --      Height --      Head Circumference --      Peak Flow --      Pain Score 05/10/20 1755 8     Pain Loc --      Pain Edu? --      Excl. in [place]? --    No data found.  Updated Vital Signs BP (!) 155/92 (BP Location: Left Arm)   Pulse 81   Temp 98.7 F (37.1 C) (Oral)   Resp 16   LMP 11/25/2019   SpO2 100%   Visual Acuity Right Eye Distance:   Left Eye Distance:   Bilateral Distance:    Right Eye Near:   Left Eye Near:    Bilateral Near:     Physical Exam Vitals and nursing note reviewed.  Constitutional:      Appearance: Normal appearance. She is not ill-appearing.  HENT:     Head: Atraumatic.  Eyes:     Extraocular Movements: Extraocular movements intact.     Conjunctiva/sclera: Conjunctivae normal.  Cardiovascular:     Rate and Rhythm: Normal rate and regular rhythm.     Heart sounds: Normal heart sounds.  Pulmonary:     Effort: Pulmonary effort is normal. No respiratory distress.     Breath sounds: Normal breath sounds. No wheezing or rales.     Comments: Chest rise symmetric b/l Abdominal:     General: Bowel sounds are normal. There is no distension.     Palpations: Abdomen is soft.     Tenderness: There is no abdominal tenderness. There is no right CVA tenderness, left CVA tenderness or guarding.  Musculoskeletal:     Cervical back: Normal range of motion and neck supple.     Comments: Antalgic movements Diffuse ttp left anterior and lateral ribs, no point tenderness  in any one specific spot. No obvious deformities  palpable, no bruising  Skin:    General: Skin is warm and dry.  Neurological:     Mental Status: She is alert and oriented to person, place, and time.  Psychiatric:        Mood and Affect: Mood normal.        Thought Content: Thought content normal.        Judgment: Judgment normal.      UC Treatments / Results  Labs (all labs ordered are listed, but only abnormal results are displayed) Labs Reviewed - No data to display  EKG   Radiology DG Ribs Unilateral W/Chest Left  Result Date: 05/10/2020 CLINICAL DATA:  Left rib pain after fall. EXAM: LEFT RIBS AND CHEST - 3+ VIEW COMPARISON:  December 26, 2019. FINDINGS: No fracture or other bone lesions are seen involving the ribs. There is no evidence of pneumothorax or pleural effusion. Both lungs are clear. Heart size and mediastinal contours are within normal limits. IMPRESSION: Negative. Electronically Signed   By: Marijo Conception M.D.   On: 05/10/2020 18:46    Procedures Procedures (including critical care time)  Medications Ordered in UC Medications - No data to display  Initial Impression / Assessment and Plan / UC Course  I have reviewed the triage vital signs and the nursing notes.  Pertinent labs & imaging results that were available during my care of the patient were reviewed by me and considered in my medical decision making (see chart for details).     Chest/rib x-ray negative today, breath sounds full and equal b/l on exam, vital signs reassuring. Discussed small supply of norco for severe pain, OTC pain relievers otherwise and flexeril, lidocaine patches, heat, rest. Return precautions reviewed for acutely worsening sxs. Otherwise no apparent injuries from fall.   Final Clinical Impressions(s) / UC Diagnoses   Final diagnoses:  Rib pain on left side   Discharge Instructions   None    ED Prescriptions    Medication Sig Dispense Auth. Provider   HYDROcodone-acetaminophen (NORCO/VICODIN) 5-325 MG tablet Take 1  tablet by mouth every 6 (six) hours as needed for moderate pain. 15 tablet Volney American, [place]   cyclobenzaprine (FLEXERIL) 5 MG tablet Take 1 tablet (5 mg total) by mouth 3 (three) times daily as needed for muscle spasms. DO NOT DRINK ALCOHOL OR DRIVE WHILE TAKING THIS MEDICATION 15 tablet Volney American, [place]     I have reviewed the PDMP during this encounter.   Volney American, [place] 05/11/20 1206

## 2020-05-10 NOTE — ED Triage Notes (Signed)
Patient c/o LFT back pain that started today.   Patient endorses onset of symptoms began after falling off the 4th step of a ladder at home. Patient fell backwards onto a chair.   Patient endorses that laughing and movement makes pain worst.   Patient has normal ambulation.

## 2020-05-15 ENCOUNTER — Ambulatory Visit
Admission: RE | Admit: 2020-05-15 | Discharge: 2020-05-15 | Disposition: A | Discharge status: Home / Self Care | Payer: BC Managed Care – PPO | Source: Ambulatory Visit | Attending: Family Medicine | Admitting: Family Medicine

## 2020-05-15 ENCOUNTER — Other Ambulatory Visit: Payer: Self-pay

## 2020-05-15 ENCOUNTER — Ambulatory Visit: Payer: BC Managed Care – PPO

## 2020-05-15 DIAGNOSIS — Z1231 Encounter for screening mammogram for malignant neoplasm of breast: Secondary | ICD-10-CM

## 2020-08-07 ENCOUNTER — Other Ambulatory Visit: Payer: Self-pay | Admitting: Family Medicine

## 2020-08-11 NOTE — Telephone Encounter (Signed)
Spoke with patient stated she will call back  to schedule her appointment

## 2020-08-11 NOTE — Telephone Encounter (Signed)
Please schedule CPE with fasting labs prior for Dr. Bedsole. 

## 2020-08-20 NOTE — Telephone Encounter (Signed)
Spoke with Patient stated she is having problems with transportation . Will call back to schedule

## 2020-09-02 NOTE — Telephone Encounter (Signed)
Left voice message to call the office . Letter sent  

## 2020-09-05 ENCOUNTER — Encounter: Payer: BC Managed Care – PPO | Admitting: Family Medicine

## 2020-11-07 ENCOUNTER — Other Ambulatory Visit: Payer: Self-pay | Admitting: Family Medicine

## 2020-11-07 NOTE — Telephone Encounter (Signed)
Please schedule CPE with fasting labs prior with Dr. Bedsole.  

## 2020-11-10 NOTE — Telephone Encounter (Signed)
Spoke with stated she will call back to schedule appointment

## 2021-02-13 ENCOUNTER — Other Ambulatory Visit: Payer: Self-pay | Admitting: Family Medicine

## 2021-02-13 NOTE — Telephone Encounter (Signed)
Last office visit 01/03/2020 with Dr. Lorelei Pont for neck/shoulder pain. Last seen by PCP 06/29/2019.  No future appointments.  Ok to refill?

## 2021-02-23 NOTE — Progress Notes (Signed)
-------------    SDW INSTRUCTIONS:  Your procedure is scheduled on Wednesday, Nov. 2nd. Please report to [facility] Main Entrance "A" at 11:00 A.M., and check in at the Admitting office. Call this number if you have problems the morning of surgery: [phone]   Remember: Do not eat or drink after midnight the night before your surgery   Medications to take morning of surgery with a sip of water include: Omeprazole (Prilosec)  If needed: Claritin Flonase Nasal Spray Tylenol  As of today, STOP taking any Aspirin (unless otherwise instructed by your surgeon), Aleve, Naproxen, Ibuprofen, Motrin, Advil, Goody's, BC's, all herbal medications, fish oil, and all vitamins.    The Morning of Surgery Do not wear jewelry, make-up or nail polish. Do not wear lotions, powders, or perfumes, or deodorant Do not shave 48 hours prior to surgery.   Do not bring valuables to the hospital. [facility] is not responsible for any belongings or valuables.  If you are a smoker, DO NOT Smoke 24 hours prior to surgery  If you wear a CPAP at night please bring your mask the morning of surgery   Remember that you must have someone to transport you home after your surgery, and remain with you for 24 hours if you are discharged the same day.  Please bring cases for contacts, glasses, hearing aids, dentures or bridgework because it cannot be worn into surgery.   Patients discharged the day of surgery will not be allowed to drive home.   Please shower the NIGHT BEFORE/MORNING OF SURGERY (use antibacterial soap like DIAL soap if possible). Wear comfortable clothes the morning of surgery. Oral Hygiene is also important to reduce your risk of infection.  Remember - BRUSH YOUR TEETH THE MORNING OF SURGERY WITH YOUR REGULAR TOOTHPASTE  Patient denies shortness of breath, fever, cough and chest pain.

## 2021-02-23 NOTE — H&P (Signed)
Sharon Chaney is an 55 y.o. female. Presenting for surgical management of thickened endometrium in the setting of post- menopausal bleeding. She underwent work up in the office which was notable for an 11cm uterus with thickened EMS, 1.5cm fibroid abutting EMS but not in cavity, and likely polyp. Had normal TSH and CBC.   Pertinent Gynecological History: Menses: post-menopausal Bleeding: post menopausal bleeding OB History: G2, P2   Menstrual History: No LMP recorded. Patient is premenopausal.    Past Medical History:  Diagnosis Date   Complication of anesthesia    Endometriosis    Morbid obesity ([place])    Pneumonia    PONV (postoperative nausea and vomiting)     Past Surgical History:  Procedure Laterality Date   APPENDECTOMY     CESAREAN SECTION     LAPAROTOMY     TONSILLECTOMY      Family History  Problem Relation Age of Onset   Colon cancer Father 64   Heart disease Father    Heart disease Paternal Grandmother    Diabetes Paternal Grandmother    Stroke Paternal Grandfather    Arthritis Maternal Grandmother    Stroke Mother    Anesthesia problems Neg Hx    Hypotension Neg Hx    Malignant hyperthermia Neg Hx     Social History:  reports that she has never smoked. She has never used smokeless tobacco. She reports that she does not drink alcohol and does not use drugs.  Allergies:  Allergies  Allergen Reactions   Lactose Intolerance (Gi)    Promethazine Other (See Comments)    (Intolerant)--unknown reaction   Latex Rash   Tomato Rash    No medications prior to admission.    Review of Systems  There were no vitals taken for this visit. Physical Exam Gen: well appearing, NAD CV: Reg rate Pulm: NWOB Abd: soft, nondistended, nontender, no mases GYN: uterus 10 week size, no adnexa ttp/CMT Ext: No edema b/l   No results found for this or any previous visit (from the past 24 hour(s)).  No results found.  Assessment/Plan: 82 with PMB and suspected  uterine polyp presents for surgical management with HSC, D&C, polypectomy, possible myomectomy, and possible myosure. Risks discussed including infection, bleeding, damage to surrounding structures, need for additional procedures, postoperative DVT, unexpected pathology findings, and fluid overload. All questions answered. Consent signed in office.    Tyson Dense 02/23/2021, 12:47 PM

## 2021-02-24 ENCOUNTER — Other Ambulatory Visit: Payer: Self-pay

## 2021-02-24 ENCOUNTER — Encounter (HOSPITAL_COMMUNITY): Payer: Self-pay | Admitting: Obstetrics and Gynecology

## 2021-02-24 NOTE — Progress Notes (Signed)
PCP - Dr. Diona Browner Cardiologist - denies EKG -  Chest x-ray - 12/26/19 ECHO - 01/03/20 Cardiac Cath - denies CPAP -   Blood Thinner Instructions: n/a Aspirin Instructions: n/a  ERAS Protcol -   COVID TEST- n/a  Anesthesia review: n/a

## 2021-02-25 ENCOUNTER — Ambulatory Visit (HOSPITAL_COMMUNITY)
Admission: RE | Admit: 2021-02-25 | Discharge: 2021-02-25 | Disposition: A | Discharge status: Home / Self Care | Payer: BC Managed Care – PPO | Attending: Obstetrics and Gynecology | Admitting: Obstetrics and Gynecology

## 2021-02-25 ENCOUNTER — Other Ambulatory Visit: Payer: Self-pay

## 2021-02-25 ENCOUNTER — Ambulatory Visit (HOSPITAL_COMMUNITY): Payer: BC Managed Care – PPO | Admitting: Certified Registered Nurse Anesthetist

## 2021-02-25 ENCOUNTER — Encounter (HOSPITAL_COMMUNITY)
Admission: RE | Disposition: A | Discharge status: Home / Self Care | Payer: Self-pay | Source: Home / Self Care | Attending: Obstetrics and Gynecology

## 2021-02-25 ENCOUNTER — Encounter (HOSPITAL_COMMUNITY): Payer: Self-pay | Admitting: Obstetrics and Gynecology

## 2021-02-25 DIAGNOSIS — Z6838 Body mass index (BMI) 38.0-38.9, adult: Secondary | ICD-10-CM | POA: Diagnosis not present

## 2021-02-25 DIAGNOSIS — D259 Leiomyoma of uterus, unspecified: Secondary | ICD-10-CM | POA: Insufficient documentation

## 2021-02-25 DIAGNOSIS — N920 Excessive and frequent menstruation with regular cycle: Secondary | ICD-10-CM

## 2021-02-25 DIAGNOSIS — K219 Gastro-esophageal reflux disease without esophagitis: Secondary | ICD-10-CM | POA: Insufficient documentation

## 2021-02-25 HISTORY — PX: DILATATION & CURETTAGE/HYSTEROSCOPY WITH MYOSURE: SHX6511

## 2021-02-25 LAB — CBC
HCT: 42.7 % (ref 36.0–46.0)
Hemoglobin: 14.1 g/dL (ref 12.0–15.0)
MCH: 30.1 pg (ref 26.0–34.0)
MCHC: 33 g/dL (ref 30.0–36.0)
MCV: 91.2 fL (ref 80.0–100.0)
Platelets: 305 10*3/uL (ref 150–400)
RBC: 4.68 MIL/uL (ref 3.87–5.11)
RDW: 13.8 % (ref 11.5–15.5)
WBC: 4.9 10*3/uL (ref 4.0–10.5)
nRBC: 0 % (ref 0.0–0.2)

## 2021-02-25 LAB — TYPE AND SCREEN
ABO/RH(D): O POS
Antibody Screen: NEGATIVE

## 2021-02-25 LAB — ABO/RH: ABO/RH(D): O POS

## 2021-02-25 LAB — POCT PREGNANCY, URINE: Preg Test, Ur: NEGATIVE

## 2021-02-25 SURGERY — DILATATION & CURETTAGE/HYSTEROSCOPY WITH MYOSURE
Anesthesia: General | Site: Vagina

## 2021-02-25 MED ORDER — ONDANSETRON HCL 4 MG/2ML IJ SOLN
INTRAMUSCULAR | Status: DC | PRN
  Administered 2021-02-25: 4 mg via INTRAVENOUS

## 2021-02-25 MED ORDER — FENTANYL CITRATE (PF) 250 MCG/5ML IJ SOLN
INTRAMUSCULAR | Status: AC
  Filled 2021-02-25: qty 5

## 2021-02-25 MED ORDER — LIDOCAINE HCL 1 % IJ SOLN
INTRAMUSCULAR | Status: DC | PRN
  Administered 2021-02-25: 10 mL

## 2021-02-25 MED ORDER — KETOROLAC TROMETHAMINE 30 MG/ML IJ SOLN
INTRAMUSCULAR | Status: AC
  Filled 2021-02-25: qty 1

## 2021-02-25 MED ORDER — CHLORHEXIDINE GLUCONATE 0.12 % MT SOLN: 15.0000 mL | Freq: Once | OROMUCOSAL | Status: AC

## 2021-02-25 MED ORDER — DEXAMETHASONE SODIUM PHOSPHATE 10 MG/ML IJ SOLN
INTRAMUSCULAR | Status: AC
  Filled 2021-02-25: qty 1

## 2021-02-25 MED ORDER — LIDOCAINE HCL (PF) 1 % IJ SOLN
INTRAMUSCULAR | Status: AC
  Filled 2021-02-25: qty 30

## 2021-02-25 MED ORDER — POVIDONE-IODINE 10 % EX SWAB
2.0000 "application " | Freq: Once | CUTANEOUS | Status: AC
  Administered 2021-02-25: 2 via TOPICAL

## 2021-02-25 MED ORDER — LIDOCAINE 2% (20 MG/ML) 5 ML SYRINGE
INTRAMUSCULAR | Status: DC | PRN
  Administered 2021-02-25: 60 mg via INTRAVENOUS

## 2021-02-25 MED ORDER — SCOPOLAMINE 1 MG/3DAYS TD PT72: 1.0000 | MEDICATED_PATCH | TRANSDERMAL | Status: DC

## 2021-02-25 MED ORDER — LACTATED RINGERS IV SOLN: INTRAVENOUS | Status: DC

## 2021-02-25 MED ORDER — ACETAMINOPHEN 500 MG PO TABS
ORAL_TABLET | ORAL | Status: AC
  Filled 2021-02-25: qty 2

## 2021-02-25 MED ORDER — SILVER NITRATE-POT NITRATE 75-25 % EX MISC
CUTANEOUS | Status: AC
  Filled 2021-02-25: qty 10

## 2021-02-25 MED ORDER — LIDOCAINE 2% (20 MG/ML) 5 ML SYRINGE
INTRAMUSCULAR | Status: AC
  Filled 2021-02-25: qty 5

## 2021-02-25 MED ORDER — PROPOFOL 10 MG/ML IV BOLUS
INTRAVENOUS | Status: AC
  Filled 2021-02-25: qty 20

## 2021-02-25 MED ORDER — ACETAMINOPHEN 500 MG PO TABS: 1000.0000 mg | ORAL_TABLET | ORAL | Status: DC

## 2021-02-25 MED ORDER — FENTANYL CITRATE (PF) 250 MCG/5ML IJ SOLN
INTRAMUSCULAR | Status: DC | PRN
  Administered 2021-02-25 (×3): 50 ug via INTRAVENOUS

## 2021-02-25 MED ORDER — AMISULPRIDE (ANTIEMETIC) 5 MG/2ML IV SOLN: 10.0000 mg | Freq: Once | INTRAVENOUS | Status: DC | PRN

## 2021-02-25 MED ORDER — FENTANYL CITRATE (PF) 100 MCG/2ML IJ SOLN
25.0000 ug | INTRAMUSCULAR | Status: DC | PRN
  Administered 2021-02-25: 50 ug via INTRAVENOUS

## 2021-02-25 MED ORDER — FENTANYL CITRATE (PF) 100 MCG/2ML IJ SOLN
INTRAMUSCULAR | Status: AC
  Filled 2021-02-25: qty 2

## 2021-02-25 MED ORDER — SODIUM CHLORIDE 0.9 % IR SOLN
Status: DC | PRN
  Administered 2021-02-25: 3000 mL

## 2021-02-25 MED ORDER — ORAL CARE MOUTH RINSE: 15.0000 mL | Freq: Once | OROMUCOSAL | Status: AC

## 2021-02-25 MED ORDER — KETOROLAC TROMETHAMINE 30 MG/ML IJ SOLN
30.0000 mg | Freq: Once | INTRAMUSCULAR | Status: AC
  Administered 2021-02-25: 30 mg via INTRAVENOUS

## 2021-02-25 MED ORDER — SCOPOLAMINE 1 MG/3DAYS TD PT72
MEDICATED_PATCH | TRANSDERMAL | Status: AC
  Administered 2021-02-25: 1.5 mg via TRANSDERMAL
  Filled 2021-02-25: qty 1

## 2021-02-25 MED ORDER — MIDAZOLAM HCL 2 MG/2ML IJ SOLN
INTRAMUSCULAR | Status: AC
  Filled 2021-02-25: qty 2

## 2021-02-25 MED ORDER — PROPOFOL 10 MG/ML IV BOLUS
INTRAVENOUS | Status: DC | PRN
  Administered 2021-02-25: 150 mg via INTRAVENOUS
  Administered 2021-02-25: 30 mg via INTRAVENOUS

## 2021-02-25 MED ORDER — CHLORHEXIDINE GLUCONATE 0.12 % MT SOLN
OROMUCOSAL | Status: AC
  Administered 2021-02-25: 15 mL via OROMUCOSAL
  Filled 2021-02-25: qty 15

## 2021-02-25 MED ORDER — PROPOFOL 500 MG/50ML IV EMUL
INTRAVENOUS | Status: DC | PRN
  Administered 2021-02-25: 125 ug/kg/min via INTRAVENOUS

## 2021-02-25 MED ORDER — MIDAZOLAM HCL 5 MG/5ML IJ SOLN
INTRAMUSCULAR | Status: DC | PRN
  Administered 2021-02-25: 2 mg via INTRAVENOUS

## 2021-02-25 MED ORDER — DEXAMETHASONE SODIUM PHOSPHATE 10 MG/ML IJ SOLN
INTRAMUSCULAR | Status: DC | PRN
Start: 2021-02-25 — End: 2021-02-25
  Administered 2021-02-25: 5 mg via INTRAVENOUS

## 2021-02-25 MED ORDER — ONDANSETRON HCL 4 MG/2ML IJ SOLN
INTRAMUSCULAR | Status: AC
  Filled 2021-02-25: qty 2

## 2021-02-25 SURGICAL SUPPLY — 17 items
CATH SILICONE 16FRX5CC (CATHETERS) ×2 IMPLANT
DEVICE MYOSURE LITE (MISCELLANEOUS) IMPLANT
DILATOR CANAL MILEX (MISCELLANEOUS) IMPLANT
GLOVE SURG ENC MOIS LTX SZ6.5 (GLOVE) ×2 IMPLANT
GLOVE SURG UNDER POLY LF SZ6.5 (GLOVE) ×2 IMPLANT
GLOVE SURG UNDER POLY LF SZ7 (GLOVE) ×2 IMPLANT
GOWN STRL REUS W/ TWL LRG LVL3 (GOWN DISPOSABLE) ×2 IMPLANT
GOWN STRL REUS W/TWL LRG LVL3 (GOWN DISPOSABLE) ×4
KIT PROCEDURE FLUENT (KITS) ×2 IMPLANT
KIT TURNOVER KIT B (KITS) ×2 IMPLANT
MYOSURE XL FIBROID REM (MISCELLANEOUS) ×2
PACK VAGINAL MINOR WOMEN LF (CUSTOM PROCEDURE TRAY) ×2 IMPLANT
PAD OB MATERNITY 4.3X12.25 (PERSONAL CARE ITEMS) ×2 IMPLANT
SEAL ROD LENS SCOPE MYOSURE (ABLATOR) ×2 IMPLANT
SYSTEM TISS REMOVAL MYSR XL RM (MISCELLANEOUS) ×1 IMPLANT
TOWEL GREEN STERILE FF (TOWEL DISPOSABLE) ×4 IMPLANT
UNDERPAD 30X36 HEAVY ABSORB (UNDERPADS AND DIAPERS) ×2 IMPLANT

## 2021-02-25 NOTE — Anesthesia Procedure Notes (Signed)
Procedure Name: LMA Insertion Date/Time: 02/25/2021 12:36 PM Performed by: Colin Benton, CRNA Pre-anesthesia Checklist: Patient identified, Emergency Drugs available, Suction available and Patient being monitored Patient Re-evaluated:Patient Re-evaluated prior to induction Oxygen Delivery Method: Circle system utilized Preoxygenation: Pre-oxygenation with 100% oxygen Induction Type: IV induction Ventilation: Mask ventilation without difficulty LMA: LMA inserted LMA Size: 4.0 Number of attempts: 1 Placement Confirmation: positive ETCO2 Tube secured with: Tape Dental Injury: Teeth and Oropharynx as per pre-operative assessment

## 2021-02-25 NOTE — Progress Notes (Signed)
No updates to above H&P. Patient arrived NPO and was consented in PACU. Risks again discussed, all questions answered, and consent signed. Proceed with above surgery.    Jelitza Manninen MD  

## 2021-02-25 NOTE — Anesthesia Preprocedure Evaluation (Signed)
Anesthesia Evaluation  Patient identified by MRN, date of birth, ID band Patient awake    Reviewed: Allergy & Precautions, NPO status , Patient's Chart, lab work & pertinent test results  History of Anesthesia Complications (+) PONV  Airway Mallampati: III  TM Distance: >3 FB Neck ROM: Full    Dental  (+) Dental Advisory Given   Pulmonary neg pulmonary ROS,    breath sounds clear to auscultation       Cardiovascular negative cardio ROS   Rhythm:Regular Rate:Normal     Neuro/Psych  Headaches,    GI/Hepatic Neg liver ROS, GERD  Medicated,  Endo/Other  negative endocrine ROS  Renal/GU negative Renal ROS     Musculoskeletal   Abdominal   Peds  Hematology negative hematology ROS (+)   Anesthesia Other Findings   Reproductive/Obstetrics                             Anesthesia Physical Anesthesia Plan  ASA: 2  Anesthesia Plan: General   Post-op Pain Management:    Induction: Intravenous  PONV Risk Score and Plan: 4 or greater and Scopolamine patch - Pre-op, Midazolam, TIVA, Dexamethasone, Ondansetron and Treatment may vary due to age or medical condition  Airway Management Planned: LMA  Additional Equipment: None  Intra-op Plan:   Post-operative Plan: Extubation in OR  Informed Consent: I have reviewed the patients History and Physical, chart, labs and discussed the procedure including the risks, benefits and alternatives for the proposed anesthesia with the patient or authorized representative who has indicated his/her understanding and acceptance.       Plan Discussed with: CRNA  Anesthesia Plan Comments:         Anesthesia Quick Evaluation

## 2021-02-25 NOTE — Progress Notes (Signed)
Fentanyl 145mcg wasted in proper place witness by M. Hasting RN

## 2021-02-25 NOTE — Transfer of Care (Signed)
Immediate Anesthesia Transfer of Care Note  Patient: Sharon Chaney  Procedure(s) Performed: DILATATION & CURETTAGE/HYSTEROSCOPY WITH   MYOSURE, RESECTION OF ENDOMETRIAL FOBROID (Vagina )  Patient Location: PACU  Anesthesia Type:General  Level of Consciousness: drowsy and patient cooperative  Airway & Oxygen Therapy: Patient Spontanous Breathing and Patient connected to nasal cannula oxygen  Post-op Assessment: Report given to RN and Post -op Vital signs reviewed and stable  Post vital signs: Reviewed and stable  Last Vitals:  Vitals Value Taken Time  BP 133/74 02/25/21 1324  Temp    Pulse 72 02/25/21 1325  Resp 15 02/25/21 1325  SpO2 100 % 02/25/21 1325  Vitals shown include unvalidated device data.  Last Pain:  Vitals:   02/25/21 1056  TempSrc:   PainSc: 3       Patients Stated Pain Goal: 0 (57/01/77 9390)  Complications: No notable events documented.

## 2021-02-25 NOTE — Op Note (Signed)
PREOPERATIVE DIAGNOSES: 1. 02/25/21  POSTOPERATIVE DIAGNOSES: Same  PROCEDURE PERFORMED: Dilation, curretage, polypectomy, myomectomy, hysteroscopy  SURGEON: Dr. Lucillie Garfinkel  ANESTHESIA: General  ESTIMATED BLOOD LOSS: 50cc.  COMPLICATIONS: None  TUBES: None.  DRAINS: None  PATHOLOGY: Endometrial curretings and polyp and possible fibroid  FINDINGS: On exam, under anesthesia, normal appearing vulva and vagina, 10 week sized uterus  Operative findings demonstrated two polyps vs fibrois and a plethora of fluffy endometrium. B/l ostia visualized  Procedure: The patient was taken to the operating room where she was properly prepped and draped in sterile manner under general anesthesia. After bimanual examination, the cervix was exposed with a weighted vaginal speculum and the anterior lip of the cervix grasped with a tenaculum.  The endocervical canal was then progressively dilated to 54mm. The hysteroscope was then introduced into the uterine cavity using sterile saline solution as a distending media and with attached video camera. The endometrial cavity was distended with fluids and the cavity with the b/l ostia were visualized. Myosure device was used to resect both areas of concern.  Sharp curretage was then performed. The scope was reintroduced and several pictures were taken of the endometrial cavity and the hysteroscope removed from the cavity. The sponge and lap counts were correct times 2 at this time. The patient's procedure was terminated. We then awakened her. She was sent to the Recovery Room in good condition.    Lucillie Garfinkel MD

## 2021-02-25 NOTE — Anesthesia Postprocedure Evaluation (Signed)
Anesthesia Post Note  Patient: [place]  Procedure(s) Performed: DILATATION & CURETTAGE/HYSTEROSCOPY WITH   MYOSURE, RESECTION OF ENDOMETRIAL FOBROID (Vagina )     Patient location during evaluation: PACU Anesthesia Type: General Level of consciousness: awake and alert Pain management: pain level controlled Vital Signs Assessment: post-procedure vital signs reviewed and stable Respiratory status: spontaneous breathing, nonlabored ventilation, respiratory function stable and patient connected to nasal cannula oxygen Cardiovascular status: blood pressure returned to baseline and stable Postop Assessment: no apparent nausea or vomiting Anesthetic complications: no   No notable events documented.  Last Vitals:  Vitals:   02/25/21 1340 02/25/21 1355  BP: 118/71 122/76  Pulse: 63 66  Resp: 12 19  Temp:  36.6 C  SpO2: 93% 97%    Last Pain:  Vitals:   02/25/21 1355  TempSrc:   PainSc: 2                  Tiajuana Amass

## 2021-02-26 ENCOUNTER — Encounter (HOSPITAL_COMMUNITY): Payer: Self-pay | Admitting: Obstetrics and Gynecology

## 2021-02-26 LAB — SURGICAL PATHOLOGY

## 2021-03-10 ENCOUNTER — Encounter: Payer: Self-pay | Admitting: Family Medicine

## 2021-03-10 ENCOUNTER — Other Ambulatory Visit: Payer: Self-pay

## 2021-03-10 ENCOUNTER — Ambulatory Visit: Payer: BC Managed Care – PPO | Admitting: Family Medicine

## 2021-03-10 ENCOUNTER — Telehealth: Payer: Self-pay

## 2021-03-10 VITALS — BP 120/76 | HR 83 | Temp 97.6°F | Ht 64.0 in | Wt 220.0 lb

## 2021-03-10 DIAGNOSIS — H6983 Other specified disorders of Eustachian tube, bilateral: Secondary | ICD-10-CM

## 2021-03-10 MED ORDER — AMOXICILLIN 500 MG PO CAPS: 1000.0000 mg | ORAL_CAPSULE | Freq: Two times a day (BID) | ORAL | 0 refills | Status: DC

## 2021-03-10 NOTE — Progress Notes (Signed)
Patient ID: Sharon Chaney, female    DOB: November 21, 1965, 55 y.o.   MRN: [id number]  This visit was conducted in person.  BP 120/76   Pulse 83   Temp 97.6 F (36.4 C) (Temporal)   Ht 5\' 4"  (1.626 m)   Wt 220 lb (99.8 kg)   LMP 02/10/2021   SpO2 98%   BMI 37.76 kg/m    CC:  Chief Complaint  Patient presents with   Ear Pain    Bilateral    Dizziness    When bending over or getting up too fast   Tinnitus    Subjective:   HPI: Sharon Chaney is a 55 y.o. female  with history of BPPV, recent CVID 88 presenting on 03/10/2021 for Ear Pain (Bilateral/), Dizziness (When bending over or getting up too fast), and Tinnitus  She reports  ear fullness, and congestion, bilateral ear pain... ongoing x  02/27/2021  She has uterine polyp and fibroid removed on 02/26/2021.. started with ST following .. had COVID test positive  02/26/21 Had headache, mild chest congestion, fatigue. Had fever for 24 hours 100 F.  Occ dizziness /vertigo... with head movement.  Bilateral tinnitus worse than usual.  Since then she has felt better except for her ears.   Has been been using tylenol. Flonase and loratadine for allergies.      Relevant past medical, surgical, family and social history reviewed and updated as indicated. Interim medical history since our last visit reviewed. Allergies and medications reviewed and updated. Outpatient Medications Prior to Visit  Medication Sig Dispense Refill   acetaminophen (TYLENOL) 500 MG tablet Take 2 tablets (1,000 mg total) by mouth every 6 (six) hours as needed. 30 tablet 0   cholecalciferol (VITAMIN D) 1000 units tablet Take 1,000 Units by mouth in the morning.     fluticasone (FLONASE) 50 MCG/ACT nasal spray SPRAY 2 SPRAYS INTO EACH NOSTRIL EVERY DAY 16 mL 1   ibuprofen (ADVIL) 200 MG tablet Take 400-600 mg by mouth every 8 (eight) hours as needed (pain.).     loratadine (CLARITIN) 10 MG tablet Take 10 mg by mouth in the morning.     omeprazole  (PRILOSEC) 20 MG capsule TAKE 1 CAPSULE BY MOUTH EVERY DAY (Patient taking differently: Take 20 mg by mouth daily as needed (acid reflux/indigestion.).) 90 capsule 0   Alpha Lipoic Acid 200 MG CAPS Take 200 mg by mouth in the morning. (Patient not taking: Reported on 03/10/2021)     COLLAGEN PO Take 1 Scoop by mouth daily. (Patient not taking: Reported on 03/10/2021)     Collagen-Boron-Hyaluronic Acid ([employer] JOINT HEALTH TRIPLE ACTION PO) Take 1 tablet by mouth in the morning. (Patient not taking: Reported on 03/10/2021)     Multiple Vitamin (MULTIVITAMIN WITH MINERALS) TABS tablet Take 1 tablet by mouth daily. (Patient not taking: Reported on 03/10/2021)     No facility-administered medications prior to visit.     Per HPI unless specifically indicated in ROS section below Review of Systems Objective:  BP 120/76   Pulse 83   Temp 97.6 F (36.4 C) (Temporal)   Ht 5\' 4"  (1.626 m)   Wt 220 lb (99.8 kg)   LMP 02/10/2021   SpO2 98%   BMI 37.76 kg/m   Wt Readings from Last 3 Encounters:  03/10/21 220 lb (99.8 kg)  02/25/21 227 lb (103 kg)  01/03/20 214 lb 12 oz (97.4 kg)      Physical Exam Constitutional:  General: She is not in acute distress.    Appearance: Normal appearance. She is well-developed. She is not ill-appearing or toxic-appearing.  HENT:     Head: Normocephalic.     Right Ear: Hearing, ear canal and external ear normal. No tenderness. A middle ear effusion is present. Tympanic membrane is not injected, erythematous, retracted or bulging.     Left Ear: Hearing, ear canal and external ear normal. No tenderness. A middle ear effusion is present. Tympanic membrane is not injected, erythematous, retracted or bulging.     Nose: No mucosal edema or rhinorrhea.     Right Sinus: No maxillary sinus tenderness or frontal sinus tenderness.     Left Sinus: No maxillary sinus tenderness or frontal sinus tenderness.     Mouth/Throat:     Pharynx: Uvula midline.  Eyes:      General: Lids are normal. Lids are everted, no foreign bodies appreciated.     Conjunctiva/sclera: Conjunctivae normal.     Pupils: Pupils are equal, round, and reactive to light.  Neck:     Thyroid: No thyroid mass or thyromegaly.     Vascular: No carotid bruit.     Trachea: Trachea normal.  Cardiovascular:     Rate and Rhythm: Normal rate and regular rhythm.     Pulses: Normal pulses.     Heart sounds: Normal heart sounds, S1 normal and S2 normal. No murmur heard.   No friction rub. No gallop.  Pulmonary:     Effort: Pulmonary effort is normal. No tachypnea or respiratory distress.     Breath sounds: Normal breath sounds. No decreased breath sounds, wheezing, rhonchi or rales.  Abdominal:     General: Bowel sounds are normal.     Palpations: Abdomen is soft.     Tenderness: There is no abdominal tenderness.  Musculoskeletal:     Cervical back: Normal range of motion and neck supple.  Skin:    General: Skin is warm and dry.     Findings: No rash.  Neurological:     Mental Status: She is alert.  Psychiatric:        Mood and Affect: Mood is not anxious or depressed.        Speech: Speech normal.        Behavior: Behavior normal. Behavior is cooperative.        Thought Content: Thought content normal.        Judgment: Judgment normal.      Results for orders placed or performed during the hospital encounter of 02/25/21  CBC per protocol  Result Value Ref Range   WBC 4.9 4.0 - 10.5 K/uL   RBC 4.68 3.87 - 5.11 MIL/uL   Hemoglobin 14.1 12.0 - 15.0 g/dL   HCT 42.7 36.0 - 46.0 %   MCV 91.2 80.0 - 100.0 fL   MCH 30.1 26.0 - 34.0 pg   MCHC 33.0 30.0 - 36.0 g/dL   RDW 13.8 11.5 - 15.5 %   Platelets 305 150 - 400 K/uL   nRBC 0.0 0.0 - 0.2 %  Pregnancy, urine POC  Result Value Ref Range   Preg Test, Ur NEGATIVE NEGATIVE  Type and screen [place]  Result Value Ref Range   ABO/RH(D) O POS    Antibody Screen NEG    Sample Expiration       02/28/2021,2359 Performed at [facility], [place] [address] [place], [place] [address]   ABO/Rh  Result Value Ref Range  ABO/RH(D)      O POS Performed at [facility], [place] [address] [place], [place] [address]   Surgical pathology  Result Value Ref Range   SURGICAL PATHOLOGY      SURGICAL PATHOLOGY CASE: [id number] PATIENT: Argentina Donovan Surgical Pathology Report     Clinical History: Polyp (nt)   FINAL MICROSCOPIC DIAGNOSIS:  A. FIBROID, MYOMECTOMY: -  Disordered proliferative endometrium with features of progestin effect -  Benign smooth muscle focally consistent with leiomyoma -  No malignancy identified  COMMENT:  Dr. Saralyn Pilar reviewed the case and agrees with the above diagnosis.   GROSS DESCRIPTION:  Received fresh are fragments of soft tan-white tissue which measure 2 x 2 x 0.3 cm in aggregate.  The specimen is entirely submitted in 1 cassette.  [facility] 02/25/2021)   Final Diagnosis performed by Thressa Sheller, MD.   Electronically signed 02/26/2021 Technical and / or Professional components performed at [facility]. [facility], [place] [address], [place], [place] [address].  Immunohistochemistry Technical component (if applicable) was performed at [facility]. [address], [place], [place] [address].   IMMUNOHISTOCHEMISTRY DISCLAIMER (if applicable): Some of these immunohistochemical stains may have been developed and the performance characteristics determine by [facility]. Some may not have been cleared or approved by the U.S. Food and Drug Administration. The FDA has determined that such clearance or approval is not necessary. This test is used for clinical purposes. It should not be regarded as investigational or for research. This laboratory is certified under the [place] (CLIA-88) as qualified to perform high complexity clinical  laboratory testing.  The controls stained appropriately.     This visit occurred during the SARS-CoV-2 public health emergency.  Safety protocols were in place, including screening questions prior to the visit, additional usage of staff PPE, and extensive cleaning of exam room while observing appropriate contact time as indicated for disinfecting solutions.   COVID 19 screen:  No recent travel or known exposure to COVID19 The patient denies respiratory symptoms of COVID 19 at this time. The importance of social distancing was discussed today.   Assessment and Plan    Problem List Items Addressed This Visit     ETD (Eustachian tube dysfunction), bilateral - Primary     No current infections but info given on S/S of bacterial superinfection. Rx for amox given to use if needed.  She Has SE to prednisone and cannot use.  Continue flonase 2 sprays per nostril.  Start nasal saline spray or irrigation.  Start mucinex D  once, can stop if palpitations.  Can use ibuprofen 800 mg three times  a day for ear pain.  Call if new fever late in illness... if one ear is increasing to severe painful, one sided face pain. If not improving refer to ENT.           Eliezer Lofts, MD

## 2021-03-10 NOTE — Telephone Encounter (Signed)
[facility] - Client TELEPHONE ADVICE RECORD [facility] Patient Name: Sharon Chaney Gender: Female DOB: April 29, 1965 Age: 55 Y 9 M 23 D Return Phone Number: [phone] (Primary) Address: City/ State/ Zip: [place]  [address] Client [facility] - Client Client Site [facility] - Day Physician Eliezer Lofts - MD Contact Type Call Who Is Calling Patient / Member / Family / Caregiver Call Type Triage / Clinical Relationship To Patient Self Return Phone Number [phone] (Primary) Chief Complaint Ear Fullness or Congestion Reason for Call Symptomatic / Request for Health Information Initial Comment Caller is c/o ear pressure and congestion onset 12 days ago. She is also c/o some dizziness. Translation No Disp. Time Eilene Ghazi Time) Disposition Final User 03/10/2021 1:19:53 PM Attempt made - message left Emch, RN, Ronalee Belts 03/10/2021 3:09:43 PM FINAL ATTEMPT MADE - message left Emch, RN, Ronalee Belts 03/10/2021 3:09:49 PM Send to RN Final Attempt Eliezer Bottom, RN, Ronalee Belts 03/10/2021 3:13:24 PM FINAL ATTEMPT MADE - message left Yes Emch, RN, Mik

## 2021-03-10 NOTE — Patient Instructions (Addendum)
Continue flonase 2 sprays per nostril.  Start nasal saline spray or irrigation.  Start mucinex D  once, can stop if palpitations.  Can use ibuprofen 800 mg three times  a day for ear pain.  Call if new fever late in illness... if one ear is increasing to severe painful, one sided face pain.

## 2021-03-10 NOTE — Telephone Encounter (Signed)
Per chart review tab pt already had appt with Dr Diona Browner today at [facility].

## 2021-03-10 NOTE — Assessment & Plan Note (Signed)
No current infections but info given on S/S of bacterial superinfection. Rx for amox given to use if needed.  She Has SE to prednisone and cannot use.  Continue flonase 2 sprays per nostril.  Start nasal saline spray or irrigation.  Start mucinex D  once, can stop if palpitations.  Can use ibuprofen 800 mg three times  a day for ear pain.  Call if new fever late in illness... if one ear is increasing to severe painful, one sided face pain. If not improving refer to ENT.

## 2021-03-27 ENCOUNTER — Ambulatory Visit: Payer: BC Managed Care – PPO | Admitting: Nurse Practitioner

## 2021-03-27 ENCOUNTER — Other Ambulatory Visit: Payer: Self-pay

## 2021-03-27 VITALS — BP 128/62 | HR 87 | Temp 97.0°F | Resp 12 | Ht 64.0 in | Wt 222.0 lb

## 2021-03-27 DIAGNOSIS — R1030 Lower abdominal pain, unspecified: Secondary | ICD-10-CM | POA: Insufficient documentation

## 2021-03-27 DIAGNOSIS — R109 Unspecified abdominal pain: Secondary | ICD-10-CM | POA: Diagnosis not present

## 2021-03-27 DIAGNOSIS — R0789 Other chest pain: Secondary | ICD-10-CM | POA: Diagnosis not present

## 2021-03-27 DIAGNOSIS — R10A0 Flank pain, unspecified side: Secondary | ICD-10-CM | POA: Insufficient documentation

## 2021-03-27 LAB — POCT URINALYSIS DIP (CLINITEK)
Bilirubin, UA: NEGATIVE
Blood, UA: NEGATIVE
Glucose, UA: NEGATIVE mg/dL
Ketones, POC UA: NEGATIVE mg/dL
Leukocytes, UA: NEGATIVE
Nitrite, UA: NEGATIVE
Spec Grav, UA: >=1.03 — AB (ref 1.010–1.025)
Urobilinogen, UA: 0.2 E.U./dL
pH, UA: 5.5 (ref 5.0–8.0)

## 2021-03-27 MED ORDER — KETOROLAC TROMETHAMINE 30 MG/ML IJ SOLN
30.0000 mg | Freq: Once | INTRAMUSCULAR | Status: AC
  Administered 2021-03-27: 30 mg via INTRAMUSCULAR

## 2021-03-27 NOTE — Assessment & Plan Note (Signed)
Thought to have been flank pain but turns out to be right.  Peri umbilical abdominal pain that bores through to her back.

## 2021-03-27 NOTE — Patient Instructions (Signed)
Nice to see you today Avoid NSAIDs for the next 12 hours that includes Ibuprofen, aleve, BC/Goody powders, naproxen Will be in touch with the labs

## 2021-03-27 NOTE — Assessment & Plan Note (Signed)
Atypical chest pain.  Very low likelihood of cardiac origin.  Did do EKG in office which was within normal limit.  Continue to monitor

## 2021-03-27 NOTE — Progress Notes (Signed)
Acute Office Visit  Subjective:    Patient ID: Sharon Chaney, female    DOB: Feb 09, 1966, 55 y.o.   MRN: [id number]  Chief Complaint  Patient presents with   Abdominal Pain    Sx x 2 weeks. Right side of the abdomen to the right side of her back, every day. Sometimes pain is sharp, sometimes achy. No urinary issues. No particular pattern of what makes the pain better or worse. No nausea or vomiting.    Abdominal Pain Pertinent negatives include no diarrhea, fever, frequency, headaches, hematuria, nausea or vomiting.  Patient is in today for Pain  Started for approx 2 weeks. RLQ that bores through her back  Recent GYN procedure Urterine polyp and cyst ( vaginally) on 02/25/2021 and then got covid on 02/27/2021 Has had an appendectomy and endometerosis   There all the time achy in nature and sometimes can be sharp. No OTC medications   Chest pain: is stabby pinch started month. Not daily. Last for short period of time  History of family colon cancer. Father at age 12 Past Medical History:  Diagnosis Date   Complication of anesthesia    Endometriosis    Morbid obesity ([place])    Pneumonia    PONV (postoperative nausea and vomiting)     Past Surgical History:  Procedure Laterality Date   APPENDECTOMY     CESAREAN SECTION     DILATATION & CURETTAGE/HYSTEROSCOPY WITH MYOSURE N/A 02/25/2021   Procedure: DILATATION & CURETTAGE/HYSTEROSCOPY WITH   MYOSURE, RESECTION OF ENDOMETRIAL FOBROID;  Surgeon: Tyson Dense, MD;  Location: [facility];  Service: Gynecology;  Laterality: N/A;   LAPAROTOMY     TONSILLECTOMY      Family History  Problem Relation Age of Onset   Colon cancer Father 7   Heart disease Father    Heart disease Paternal Grandmother    Diabetes Paternal Grandmother    Stroke Paternal Grandfather    Arthritis Maternal Grandmother    Stroke Mother    Anesthesia problems Neg Hx    Hypotension Neg Hx    Malignant hyperthermia Neg Hx     Social History    Socioeconomic History   Marital status: Married    Spouse name: Not on file   Number of children: Not on file   Years of education: Not on file   Highest education level: Not on file  Occupational History   Not on file  Tobacco Use   Smoking status: Never   Smokeless tobacco: Never  Vaping Use   Vaping Use: Never used  Substance and Sexual Activity   Alcohol use: No   Drug use: No   Sexual activity: Yes    Birth control/protection: None    Comment: Married  Other Topics Concern   Not on file  Social History Narrative   Not on file   Social Determinants of Health   Financial Resource Strain: Not on file  Food Insecurity: Not on file  Transportation Needs: Not on file  Physical Activity: Not on file  Stress: Not on file  Social Connections: Not on file  Intimate Partner Violence: Not on file    Outpatient Medications Prior to Visit  Medication Sig Dispense Refill   acetaminophen (TYLENOL) 500 MG tablet Take 2 tablets (1,000 mg total) by mouth every 6 (six) hours as needed. 30 tablet 0   cholecalciferol (VITAMIN D) 1000 units tablet Take 1,000 Units by mouth in the morning.     fluticasone (FLONASE) 50 MCG/ACT  nasal spray SPRAY 2 SPRAYS INTO EACH NOSTRIL EVERY DAY 16 mL 1   ibuprofen (ADVIL) 200 MG tablet Take 400-600 mg by mouth every 8 (eight) hours as needed (pain.).     loratadine (CLARITIN) 10 MG tablet Take 10 mg by mouth in the morning.     Multiple Vitamin (MULTIVITAMIN WITH MINERALS) TABS tablet Take 1 tablet by mouth daily.     omeprazole (PRILOSEC) 20 MG capsule TAKE 1 CAPSULE BY MOUTH EVERY DAY (Patient taking differently: Take 20 mg by mouth daily as needed (acid reflux/indigestion.).) 90 capsule 0   Alpha Lipoic Acid 200 MG CAPS Take 200 mg by mouth in the morning. (Patient not taking: Reported on 03/10/2021)     COLLAGEN PO Take 1 Scoop by mouth daily. (Patient not taking: Reported on 03/10/2021)     Collagen-Boron-Hyaluronic Acid ([employer] JOINT HEALTH TRIPLE  ACTION PO) Take 1 tablet by mouth in the morning. (Patient not taking: Reported on 03/10/2021)     amoxicillin (AMOXIL) 500 MG capsule Take 2 capsules (1,000 mg total) by mouth 2 (two) times daily. VOID after 04/09/2021 40 capsule 0   No facility-administered medications prior to visit.    Allergies  Allergen Reactions   Lactose Intolerance (Gi)    Promethazine Other (See Comments)    (Intolerant)--unknown reaction   Latex Rash   Tomato Rash    Review of Systems  Constitutional:  Negative for chills and fever.  Respiratory:  Negative for cough and shortness of breath.   Cardiovascular:  Positive for chest pain.  Gastrointestinal:  Positive for abdominal pain. Negative for diarrhea, nausea and vomiting.  Genitourinary:  Positive for flank pain. Negative for frequency, hematuria, vaginal bleeding, vaginal discharge and vaginal pain.  Neurological:  Negative for headaches.      Objective:    Physical Exam Vitals and nursing note reviewed.  Constitutional:      Appearance: She is obese.  Cardiovascular:     Rate and Rhythm: Normal rate and regular rhythm.  Abdominal:     Palpations: Abdomen is soft. There is no hepatomegaly or splenomegaly.     Tenderness: There is abdominal tenderness in the periumbilical area. There is no right CVA tenderness or left CVA tenderness.  Musculoskeletal:        General: Normal range of motion.  Skin:    General: Skin is warm.  Neurological:     Mental Status: She is alert.  Psychiatric:        Mood and Affect: Mood normal.        Behavior: Behavior normal.        Thought Content: Thought content normal.        Judgment: Judgment normal.    BP 128/62   Pulse 87   Temp (!) 97 F (36.1 C)   Resp 12   Ht 5\' 4"  (1.626 m)   Wt 222 lb (100.7 kg)   LMP 02/10/2021   SpO2 98%   BMI 38.11 kg/m  Wt Readings from Last 3 Encounters:  03/27/21 222 lb (100.7 kg)  03/10/21 220 lb (99.8 kg)  02/25/21 227 lb (103 kg)    Health Maintenance Due   Topic Date Due   Pneumococcal Vaccine 73-76 Years old (1 - PCV) Never done   HIV Screening  Never done   Hepatitis C Screening  Never done   COLONOSCOPY (Pts 45-52yrs Insurance coverage will need to be confirmed)  Never done   Zoster Vaccines- Shingrix (1 of 2) Never done  There are no preventive care reminders to display for this patient.   Lab Results  Component Value Date   TSH 1.43 06/27/2018   Lab Results  Component Value Date   WBC 4.9 02/25/2021   HGB 14.1 02/25/2021   HCT 42.7 02/25/2021   MCV 91.2 02/25/2021   PLT 305 02/25/2021   Lab Results  Component Value Date   NA 138 06/27/2018   K 4.2 06/27/2018   CO2 26 06/27/2018   GLUCOSE 91 06/27/2018   BUN 12 06/27/2018   CREATININE 0.77 06/27/2018   BILITOT 0.6 10/19/2016   ALKPHOS 45 10/19/2016   AST 13 10/19/2016   ALT 10 10/19/2016   PROT 6.6 10/19/2016   ALBUMIN 4.2 10/19/2016   CALCIUM 9.4 06/27/2018   GFR 78.69 06/27/2018   Lab Results  Component Value Date   CHOL 224 (H) 08/27/2015   Lab Results  Component Value Date   HDL 46.30 08/27/2015   Lab Results  Component Value Date   LDLCALC 155 (H) 08/27/2015   Lab Results  Component Value Date   TRIG 113.0 08/27/2015   Lab Results  Component Value Date   CHOLHDL 5 08/27/2015   No results found for: HGBA1C     Assessment & Plan:   Problem List Items Addressed This Visit       Other   Atypical chest pain    Atypical chest pain.  Very low likelihood of cardiac origin.  Did do EKG in office which was within normal limit.  Continue to monitor      Relevant Orders   EKG 12-Lead (Completed)   CBC   Comprehensive metabolic panel   Flank pain - Primary    Thought to have been flank pain but turns out to be right.  Peri umbilical abdominal pain that bores through to her back.      Relevant Orders   POCT URINALYSIS DIP (CLINITEK) (Completed)   Lower abdominal pain    Pain in right.  Umbilical area.  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  Has not had a colonoscopy.  Father was diagnosed with colon cancer at age 105 and passed away from the same.  Patient states her bowel movements are normal for her.  Does have bowel movements daily.  Pending basic lab work, continue to monitor. Patient has had recent GYN surgery to remove uterine fibroid/cyst.  This was done vaginally.  Was cleared by GYN but if his symptoms persist may consider pelvic or GYN in nature.      Relevant Orders   CBC   Comprehensive metabolic panel   Lipase     No orders of the defined types were placed in this encounter.  This visit occurred during the SARS-CoV-2 public health emergency.  Safety protocols were in place, including screening questions prior to the visit, additional usage of staff PPE, and extensive cleaning of exam room while observing appropriate contact time as indicated for disinfecting solutions.   Romilda Garret, NP

## 2021-03-27 NOTE — Assessment & Plan Note (Signed)
Pain in right.  Umbilical area.  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  Has not had a colonoscopy.  Father was diagnosed with colon cancer at age 54 and passed away from the same.  Patient states her bowel movements are normal for her.  Does have bowel movements daily.  Pending basic lab work, continue to monitor. Patient has had recent GYN surgery to remove uterine fibroid/cyst.  This was done vaginally.  Was cleared by GYN but if his symptoms persist may consider pelvic or GYN in nature.

## 2021-03-28 LAB — COMPREHENSIVE METABOLIC PANEL
AG Ratio: 1.8 (calc) (ref 1.0–2.5)
ALT: 16 U/L (ref 6–29)
AST: 15 U/L (ref 10–35)
Albumin: 4.2 g/dL (ref 3.6–5.1)
Alkaline phosphatase (APISO): 62 U/L (ref 37–153)
BUN: 12 mg/dL (ref 7–25)
CO2: 24 mmol/L (ref 20–32)
Calcium: 9.4 mg/dL (ref 8.6–10.4)
Chloride: 106 mmol/L (ref 98–110)
Creat: 0.98 mg/dL (ref 0.50–1.03)
Globulin: 2.3 g/dL (calc) (ref 1.9–3.7)
Glucose, Bld: 108 mg/dL — ABNORMAL HIGH (ref 65–99)
Potassium: 4.3 mmol/L (ref 3.5–5.3)
Sodium: 142 mmol/L (ref 135–146)
Total Bilirubin: 0.3 mg/dL (ref 0.2–1.2)
Total Protein: 6.5 g/dL (ref 6.1–8.1)

## 2021-03-28 LAB — CBC
HCT: 42.7 % (ref 35.0–45.0)
Hemoglobin: 14.2 g/dL (ref 11.7–15.5)
MCH: 30 pg (ref 27.0–33.0)
MCHC: 33.3 g/dL (ref 32.0–36.0)
MCV: 90.3 fL (ref 80.0–100.0)
MPV: 10.3 fL (ref 7.5–12.5)
Platelets: 291 10*3/uL (ref 140–400)
RBC: 4.73 10*6/uL (ref 3.80–5.10)
RDW: 13.2 % (ref 11.0–15.0)
WBC: 6.2 10*3/uL (ref 3.8–10.8)

## 2021-03-28 LAB — LIPASE: Lipase: 15 U/L (ref 7–60)

## 2021-04-03 ENCOUNTER — Encounter: Payer: Self-pay | Admitting: Nurse Practitioner

## 2021-04-28 ENCOUNTER — Telehealth: Payer: Self-pay

## 2021-04-28 NOTE — Telephone Encounter (Signed)
Spoke with patient. Patient did not want to proceed with imaging results at this time with it been a new year and deductible with insurance re started. Patient was advised to let us know if she changes her mind and to follow up with PCP or Matt. Patient verbalized understanding.

## 2021-04-28 NOTE — Telephone Encounter (Signed)
Left message for patient to call back.  Received notice that patient did not reach her mychart from [facility], see notes below:  [place]   I have not heard back from you in regards to obtaining imaging (either [place] or [place]). I saw you have read my before message.   Thanks,   Romilda Garret, DNP, AGNP-C

## 2021-04-29 ENCOUNTER — Other Ambulatory Visit: Payer: Self-pay | Admitting: Family Medicine

## 2021-04-29 DIAGNOSIS — Z1231 Encounter for screening mammogram for malignant neoplasm of breast: Secondary | ICD-10-CM

## 2021-05-06 ENCOUNTER — Telehealth: Payer: Self-pay | Admitting: Family Medicine

## 2021-05-06 NOTE — Telephone Encounter (Signed)
Lvm for pt to call and schedule also sent my chart letter

## 2021-05-06 NOTE — Telephone Encounter (Signed)
Please attempt to schedule CPE with fasting labs prior with Dr. Diona Browner.  Okay to schedule for when we are back at [facility].

## 2021-05-08 NOTE — Telephone Encounter (Signed)
2nd attempt  LMTCB to schedule

## 2021-05-19 ENCOUNTER — Ambulatory Visit
Admission: RE | Admit: 2021-05-19 | Discharge: 2021-05-19 | Disposition: A | Discharge status: Home / Self Care | Payer: BC Managed Care – PPO | Source: Ambulatory Visit | Attending: Family Medicine | Admitting: Family Medicine

## 2021-05-19 ENCOUNTER — Ambulatory Visit: Payer: BC Managed Care – PPO

## 2021-05-19 DIAGNOSIS — Z1231 Encounter for screening mammogram for malignant neoplasm of breast: Secondary | ICD-10-CM

## 2021-05-29 ENCOUNTER — Other Ambulatory Visit: Payer: Self-pay | Admitting: Family Medicine

## 2021-06-01 NOTE — Telephone Encounter (Signed)
Called pt . Pt hung up phone

## 2021-08-21 ENCOUNTER — Encounter: Payer: Self-pay | Admitting: Family Medicine

## 2021-08-21 ENCOUNTER — Ambulatory Visit (INDEPENDENT_AMBULATORY_CARE_PROVIDER_SITE_OTHER): Payer: 59 | Admitting: Family Medicine

## 2021-08-21 VITALS — BP 120/80 | HR 89 | Temp 98.4°F | Ht 64.0 in | Wt 221.2 lb

## 2021-08-21 DIAGNOSIS — Z0000 Encounter for general adult medical examination without abnormal findings: Secondary | ICD-10-CM

## 2021-08-21 DIAGNOSIS — K219 Gastro-esophageal reflux disease without esophagitis: Secondary | ICD-10-CM

## 2021-08-21 DIAGNOSIS — Z1159 Encounter for screening for other viral diseases: Secondary | ICD-10-CM

## 2021-08-21 DIAGNOSIS — J4520 Mild intermittent asthma, uncomplicated: Secondary | ICD-10-CM

## 2021-08-21 DIAGNOSIS — L723 Sebaceous cyst: Secondary | ICD-10-CM | POA: Insufficient documentation

## 2021-08-21 DIAGNOSIS — Z1211 Encounter for screening for malignant neoplasm of colon: Secondary | ICD-10-CM

## 2021-08-21 DIAGNOSIS — Z13220 Encounter for screening for lipoid disorders: Secondary | ICD-10-CM | POA: Diagnosis not present

## 2021-08-21 DIAGNOSIS — G43109 Migraine with aura, not intractable, without status migrainosus: Secondary | ICD-10-CM

## 2021-08-21 LAB — LIPID PANEL
Cholesterol: 240 mg/dL — ABNORMAL HIGH (ref 0–200)
HDL: 44.9 mg/dL (ref >39.00)
LDL Cholesterol: 168 mg/dL — ABNORMAL HIGH (ref 0–99)
NonHDL: 194.88
Total CHOL/HDL Ratio: 5
Triglycerides: 132 mg/dL (ref 0.0–149.0)
VLDL: 26.4 mg/dL (ref 0.0–40.0)

## 2021-08-21 LAB — COMPREHENSIVE METABOLIC PANEL
ALT: 20 U/L (ref 0–35)
AST: 17 U/L (ref 0–37)
Albumin: 4.5 g/dL (ref 3.5–5.2)
Alkaline Phosphatase: 53 U/L (ref 39–117)
BUN: 6 mg/dL (ref 6–23)
CO2: 30 mEq/L (ref 19–32)
Calcium: 9.7 mg/dL (ref 8.4–10.5)
Chloride: 106 mEq/L (ref 96–112)
Creatinine, Ser: 0.86 mg/dL (ref 0.40–1.20)
GFR: 76.14 mL/min (ref >60.00)
Glucose, Bld: 99 mg/dL (ref 70–99)
Potassium: 4.5 mEq/L (ref 3.5–5.1)
Sodium: 143 mEq/L (ref 135–145)
Total Bilirubin: 0.5 mg/dL (ref 0.2–1.2)
Total Protein: 6.7 g/dL (ref 6.0–8.3)

## 2021-08-21 MED ORDER — OMEPRAZOLE 20 MG PO CPDR: 20.0000 mg | DELAYED_RELEASE_CAPSULE | Freq: Every day | ORAL | 3 refills | Status: DC | PRN

## 2021-08-21 NOTE — Progress Notes (Signed)
? ? Patient ID: Sharon Chaney, female    DOB: 1965/10/09, 56 y.o.   MRN: [id number] ? ?This visit was conducted in person. ? ?BP 120/80   Pulse 89   Temp 98.4 ?F (36.9 ?C) (Oral)   Ht '5\' 4"'$  (1.626 m)   Wt 221 lb 3 oz (100.3 kg)   LMP 02/10/2021   SpO2 98%   BMI 37.97 kg/m?   ? ?CC:  ?Chief Complaint  ?Patient presents with  ? Annual Exam  ? ? ?Subjective:  ? ?HPI: ?Sharon Chaney is a 56 y.o. female presenting on 08/21/2021 for Annual Exam ?  ?She is doing well overall with  1 new issue: ? ? Irritated area on buttock, no drainage present for months. ? ?Wt Readings from Last 3 Encounters:  ?08/21/21 221 lb 3 oz (100.3 kg)  ?03/27/21 222 lb (100.7 kg)  ?03/10/21 220 lb (99.8 kg)  ?Body mass index is 37.97 kg/m?. ? ? Exercise:  occassional ? Diet: moderate ? ?  She states she is going through menopause. ?02/2021 S/P fibroidectomy and polypectomy.. non cancerous pathology. ? GYN Dr. Alfred Levins. ? No menses  now. ? She has been more irritable and fatigued since. ? ?She has not had lab work done yet. ? ?GERD: Stable control on omeprazole 20 mg daily ? ?Mild intermittent asthma: No recent flares.  Not requiring albuterol inhaler or controller.  She treats allergic rhinitis with Flonase 2 sprays per nostril daily and Claritin 10 mg daily. ? ?Migraine with aura:  rare occurrence. ? ?Relevant past medical, surgical, family and social history reviewed and updated as indicated. Interim medical history since our last visit reviewed. ?Allergies and medications reviewed and updated. ?Outpatient Medications Prior to Visit  ?Medication Sig Dispense Refill  ? acetaminophen (TYLENOL) 500 MG tablet Take 2 tablets (1,000 mg total) by mouth every 6 (six) hours as needed. 30 tablet 0  ? Alpha Lipoic Acid 200 MG CAPS Take 200 mg by mouth in the morning.    ? cholecalciferol (VITAMIN D) 1000 units tablet Take 1,000 Units by mouth in the morning.    ? COLLAGEN PO Take 1 Scoop by mouth daily.    ? Collagen-Boron-Hyaluronic Acid ([employer]  JOINT HEALTH TRIPLE ACTION PO) Take 1 tablet by mouth in the morning.    ? fluticasone (FLONASE) 50 MCG/ACT nasal spray SPRAY 2 SPRAYS INTO EACH NOSTRIL EVERY DAY 16 mL 1  ? ibuprofen (ADVIL) 200 MG tablet Take 400-600 mg by mouth every 8 (eight) hours as needed (pain.).    ? loratadine (CLARITIN) 10 MG tablet Take 10 mg by mouth in the morning.    ? omeprazole (PRILOSEC) 20 MG capsule Take 1 capsule (20 mg total) by mouth daily as needed (acid reflux/indigestion.). 30 capsule 0  ? Multiple Vitamin (MULTIVITAMIN WITH MINERALS) TABS tablet Take 1 tablet by mouth daily.    ? ?No facility-administered medications prior to visit.  ?  ? ?Per HPI unless specifically indicated in ROS section below ?Review of Systems  ?Constitutional:  Negative for fatigue and fever.  ?HENT:  Negative for congestion.   ?Eyes:  Negative for pain.  ?Respiratory:  Negative for cough and shortness of breath.   ?Cardiovascular:  Negative for chest pain, palpitations and leg swelling.  ?Gastrointestinal:  Negative for abdominal pain.  ?Genitourinary:  Negative for dysuria and vaginal bleeding.  ?Musculoskeletal:  Negative for back pain.  ?Neurological:  Negative for syncope, light-headedness and headaches.  ?Psychiatric/Behavioral:  Negative for dysphoric mood.   ?Objective:  ?  BP 120/80   Pulse 89   Temp 98.4 ?F (36.9 ?C) (Oral)   Ht '5\' 4"'$  (1.626 m)   Wt 221 lb 3 oz (100.3 kg)   LMP 02/10/2021   SpO2 98%   BMI 37.97 kg/m?   ?Wt Readings from Last 3 Encounters:  ?08/21/21 221 lb 3 oz (100.3 kg)  ?03/27/21 222 lb (100.7 kg)  ?03/10/21 220 lb (99.8 kg)  ?  ?  ?Physical Exam ?Constitutional:   ?   General: She is not in acute distress. ?   Appearance: Normal appearance. She is well-developed. She is not ill-appearing or toxic-appearing.  ?HENT:  ?   Head: Normocephalic.  ?   Right Ear: Hearing, tympanic membrane, ear canal and external ear normal. Tympanic membrane is not erythematous, retracted or bulging.  ?   Left Ear: Hearing, tympanic  membrane, ear canal and external ear normal. Tympanic membrane is not erythematous, retracted or bulging.  ?   Nose: No mucosal edema or rhinorrhea.  ?   Right Sinus: No maxillary sinus tenderness or frontal sinus tenderness.  ?   Left Sinus: No maxillary sinus tenderness or frontal sinus tenderness.  ?   Mouth/Throat:  ?   Pharynx: Uvula midline.  ?Eyes:  ?   General: Lids are normal. Lids are everted, no foreign bodies appreciated.  ?   Conjunctiva/sclera: Conjunctivae normal.  ?   Pupils: Pupils are equal, round, and reactive to light.  ?Neck:  ?   Thyroid: No thyroid mass or thyromegaly.  ?   Vascular: No carotid bruit.  ?   Trachea: Trachea normal.  ?Cardiovascular:  ?   Rate and Rhythm: Normal rate and regular rhythm.  ?   Pulses: Normal pulses.  ?   Heart sounds: Normal heart sounds, S1 normal and S2 normal. No murmur heard. ?  No friction rub. No gallop.  ?Pulmonary:  ?   Effort: Pulmonary effort is normal. No tachypnea or respiratory distress.  ?   Breath sounds: Normal breath sounds. No decreased breath sounds, wheezing, rhonchi or rales.  ?Abdominal:  ?   General: Bowel sounds are normal.  ?   Palpations: Abdomen is soft.  ?   Tenderness: There is no abdominal tenderness.  ?Musculoskeletal:  ?   Cervical back: Normal range of motion and neck supple.  ?Skin: ?   General: Skin is warm and dry.  ?   Findings: No rash.  ? ?    ?   Comments:  Hyperpigmented sore lesion 0.5 cm, no warmth or redness, no central oore  ?Neurological:  ?   Mental Status: She is alert.  ?Psychiatric:     ?   Mood and Affect: Mood is not anxious or depressed.     ?   Speech: Speech normal.     ?   Behavior: Behavior normal. Behavior is cooperative.     ?   Thought Content: Thought content normal.     ?   Judgment: Judgment normal.  ? ?   ?Results for orders placed or performed in visit on 03/27/21  ?CBC  ?Result Value Ref Range  ? WBC 6.2 3.8 - 10.8 Thousand/uL  ? RBC 4.73 3.80 - 5.10 Million/uL  ? Hemoglobin 14.2 11.7 - 15.5 g/dL  ?  HCT 42.7 35.0 - 45.0 %  ? MCV 90.3 80.0 - 100.0 fL  ? MCH 30.0 27.0 - 33.0 pg  ? MCHC 33.3 32.0 - 36.0 g/dL  ? RDW 13.2 11.0 - 15.0 %  ?  Platelets 291 140 - 400 Thousand/uL  ? MPV 10.3 7.5 - 12.5 fL  ?Comprehensive metabolic panel  ?Result Value Ref Range  ? Glucose, Bld 108 (H) 65 - 99 mg/dL  ? BUN 12 7 - 25 mg/dL  ? Creat 0.98 0.50 - 1.03 mg/dL  ? BUN/Creatinine Ratio NOT APPLICABLE 6 - 22 (calc)  ? Sodium 142 135 - 146 mmol/L  ? Potassium 4.3 3.5 - 5.3 mmol/L  ? Chloride 106 98 - 110 mmol/L  ? CO2 24 20 - 32 mmol/L  ? Calcium 9.4 8.6 - 10.4 mg/dL  ? Total Protein 6.5 6.1 - 8.1 g/dL  ? Albumin 4.2 3.6 - 5.1 g/dL  ? Globulin 2.3 1.9 - 3.7 g/dL (calc)  ? AG Ratio 1.8 1.0 - 2.5 (calc)  ? Total Bilirubin 0.3 0.2 - 1.2 mg/dL  ? Alkaline phosphatase (APISO) 62 37 - 153 U/L  ? AST 15 10 - 35 U/L  ? ALT 16 6 - 29 U/L  ?Lipase  ?Result Value Ref Range  ? Lipase 15 7 - 60 U/L  ?POCT URINALYSIS DIP (CLINITEK)  ?Result Value Ref Range  ? Color, UA yellow yellow  ? Clarity, UA clear clear  ? Glucose, UA negative negative mg/dL  ? Bilirubin, UA negative negative  ? Ketones, POC UA negative negative mg/dL  ? Spec Grav, UA >=1.030 (A) 1.010 - 1.025  ? Blood, UA negative negative  ? pH, UA 5.5 5.0 - 8.0  ? POC PROTEIN,UA trace negative, trace  ? Urobilinogen, UA 0.2 0.2 or 1.0 E.U./dL  ? Nitrite, UA Negative Negative  ? Leukocytes, UA Negative Negative  ? ? ?This visit occurred during the SARS-CoV-2 public health emergency.  Safety protocols were in place, including screening questions prior to the visit, additional usage of staff PPE, and extensive cleaning of exam room while observing appropriate contact time as indicated for disinfecting solutions.  ? ?COVID 19 screen:  No recent travel or known exposure to [place] ?The patient denies respiratory symptoms of COVID 19 at this time. ?The importance of social distancing was discussed today.  ? ?Assessment and Plan ? ? The patient's preventative maintenance and recommended  screening tests for an annual wellness exam were reviewed in full today. ?Brought up to date unless services declined. ? ?Counselled on the importance of diet, exercise, and its role in overall health and mortality. ?The pati

## 2021-08-21 NOTE — Assessment & Plan Note (Signed)
Resolving cyst on left buttock, resolving inflammation. ? can use antibiotic ointment and topical antibiotics. Use antibacterial soap. ?

## 2021-08-21 NOTE — Assessment & Plan Note (Signed)
Chronic stable control. ?

## 2021-08-21 NOTE — Assessment & Plan Note (Signed)
Rare occurance ?

## 2021-08-21 NOTE — Patient Instructions (Addendum)
Please stop at the lab to have labs drawn. ? We will work on setting up colonoscopy ? ?

## 2021-08-21 NOTE — Assessment & Plan Note (Signed)
Stable, chronic.  Continue current medication. ? ? ? omeprazole 20 mg daily. ?

## 2021-08-24 LAB — HEPATITIS C ANTIBODY
Hepatitis C Ab: NONREACTIVE
SIGNAL TO CUT-OFF: 0.1 (ref <1.00)

## 2022-02-05 LAB — HM PAP SMEAR: HPV, high-risk: NEGATIVE

## 2022-05-19 ENCOUNTER — Other Ambulatory Visit: Payer: Self-pay | Admitting: Family Medicine

## 2022-05-19 DIAGNOSIS — Z1231 Encounter for screening mammogram for malignant neoplasm of breast: Secondary | ICD-10-CM

## 2022-05-20 ENCOUNTER — Encounter: Payer: Self-pay | Admitting: Family Medicine

## 2022-05-20 ENCOUNTER — Ambulatory Visit: Payer: 59

## 2022-05-20 ENCOUNTER — Ambulatory Visit: Payer: 59 | Admitting: Family Medicine

## 2022-05-20 VITALS — BP 118/84 | HR 70 | Temp 98.6°F | Resp 16 | Ht 64.0 in | Wt 215.4 lb

## 2022-05-20 DIAGNOSIS — H6993 Unspecified Eustachian tube disorder, bilateral: Secondary | ICD-10-CM | POA: Diagnosis not present

## 2022-05-20 NOTE — Progress Notes (Signed)
Patient ID: Sharon Chaney, female    DOB: [birth year], 57 y.o.   MRN: [id number]  This visit was conducted in person.  BP 118/84   Pulse 70   Temp 98.6 F (37 C)   Resp 16   Ht '5\' 4"'$  (1.626 m)   Wt 215 lb 6 oz (97.7 kg)   LMP 02/10/2021   SpO2 98%   BMI 36.97 kg/m    CC:  Chief Complaint  Patient presents with   Ear Fullness    X 1 week    Subjective:   HPI: Sharon Chaney is a 57 y.o. female presenting on 05/20/2022 for Ear Fullness (X 1 week)  She reports new onset ear fullness ongoing for the last week. Roaring sound, pressure and mild pain. Improved on right  some.  No runny nose, mild congestion. She went to dentist for a crown on 04/28/22.. since then she has noted intermittent vertigo. Has improved some.  She has noted decreased hearing bilaterally.  No fever, no SOB, no cough, no ST.  No sick contacts.  She does have a history of eustachian tube dysfunction and mild intermittent asthma   ON loratadine... has not been using flonase.     Relevant past medical, surgical, family and social history reviewed and updated as indicated. Interim medical history since our last visit reviewed. Allergies and medications reviewed and updated. Outpatient Medications Prior to Visit  Medication Sig Dispense Refill   acetaminophen (TYLENOL) 500 MG tablet Take 2 tablets (1,000 mg total) by mouth every 6 (six) hours as needed. 30 tablet 0   Alpha Lipoic Acid 200 MG CAPS Take 200 mg by mouth in the morning.     cholecalciferol (VITAMIN D) 1000 units tablet Take 1,000 Units by mouth in the morning.     COLLAGEN PO Take 1 Scoop by mouth daily.     Collagen-Boron-Hyaluronic Acid ([employer] JOINT HEALTH TRIPLE ACTION PO) Take 1 tablet by mouth in the morning.     fluticasone (FLONASE) 50 MCG/ACT nasal spray SPRAY 2 SPRAYS INTO EACH NOSTRIL EVERY DAY 16 mL 1   ibuprofen (ADVIL) 200 MG tablet Take 400-600 mg by mouth every 8 (eight) hours as needed (pain.).     loratadine (CLARITIN)  10 MG tablet Take 10 mg by mouth in the morning.     omeprazole (PRILOSEC) 20 MG capsule Take 1 capsule (20 mg total) by mouth daily as needed (acid reflux/indigestion.). 90 capsule 3   No facility-administered medications prior to visit.     Per HPI unless specifically indicated in ROS section below Review of Systems  Constitutional:  Negative for fatigue and fever.  HENT:  Negative for congestion.   Eyes:  Negative for pain.  Respiratory:  Negative for cough and shortness of breath.   Cardiovascular:  Negative for chest pain, palpitations and leg swelling.  Gastrointestinal:  Negative for abdominal pain.  Genitourinary:  Negative for dysuria and vaginal bleeding.  Musculoskeletal:  Negative for back pain.  Neurological:  Negative for syncope, light-headedness and headaches.  Psychiatric/Behavioral:  Negative for dysphoric mood.    Objective:  BP 118/84   Pulse 70   Temp 98.6 F (37 C)   Resp 16   Ht '5\' 4"'$  (1.626 m)   Wt 215 lb 6 oz (97.7 kg)   LMP 02/10/2021   SpO2 98%   BMI 36.97 kg/m   Wt Readings from Last 3 Encounters:  05/20/22 215 lb 6 oz (97.7 kg)  08/21/21 221 lb  3 oz (100.3 kg)  03/27/21 222 lb (100.7 kg)      Physical Exam Constitutional:      General: She is not in acute distress.    Appearance: Normal appearance. She is well-developed. She is not ill-appearing or toxic-appearing.  HENT:     Head: Normocephalic.     Right Ear: Hearing, ear canal and external ear normal. A middle ear effusion is present. Tympanic membrane is not injected, scarred, erythematous, retracted or bulging.     Left Ear: Hearing, ear canal and external ear normal. A middle ear effusion is present. Tympanic membrane is not injected, scarred, erythematous, retracted or bulging.     Nose: No mucosal edema or rhinorrhea.     Right Sinus: No maxillary sinus tenderness or frontal sinus tenderness.     Left Sinus: No maxillary sinus tenderness or frontal sinus tenderness.      Mouth/Throat:     Pharynx: Uvula midline.  Eyes:     General: Lids are normal. Lids are everted, no foreign bodies appreciated.     Conjunctiva/sclera: Conjunctivae normal.     Pupils: Pupils are equal, round, and reactive to light.  Neck:     Thyroid: No thyroid mass or thyromegaly.     Vascular: No carotid bruit.     Trachea: Trachea normal.  Cardiovascular:     Rate and Rhythm: Normal rate and regular rhythm.     Pulses: Normal pulses.     Heart sounds: Normal heart sounds, S1 normal and S2 normal. No murmur heard.    No friction rub. No gallop.  Pulmonary:     Effort: Pulmonary effort is normal. No tachypnea or respiratory distress.     Breath sounds: Normal breath sounds. No decreased breath sounds, wheezing, rhonchi or rales.  Abdominal:     General: Bowel sounds are normal.     Palpations: Abdomen is soft.     Tenderness: There is no abdominal tenderness.  Musculoskeletal:     Cervical back: Normal range of motion and neck supple.  Skin:    General: Skin is warm and dry.     Findings: No rash.  Neurological:     Mental Status: She is alert.  Psychiatric:        Mood and Affect: Mood is not anxious or depressed.        Speech: Speech normal.        Behavior: Behavior normal. Behavior is cooperative.        Thought Content: Thought content normal.        Judgment: Judgment normal.       Results for orders placed or performed in visit on 08/21/21  Hepatitis C antibody  Result Value Ref Range   Hepatitis C Ab NON-REACTIVE NON-REACTIVE   SIGNAL TO CUT-OFF 0.10 <1.00  Comprehensive metabolic panel  Result Value Ref Range   Sodium 143 135 - 145 mEq/L   Potassium 4.5 3.5 - 5.1 mEq/L   Chloride 106 96 - 112 mEq/L   CO2 30 19 - 32 mEq/L   Glucose, Bld 99 70 - 99 mg/dL   BUN 6 6 - 23 mg/dL   Creatinine, Ser 0.86 0.40 - 1.20 mg/dL   Total Bilirubin 0.5 0.2 - 1.2 mg/dL   Alkaline Phosphatase 53 39 - 117 U/L   AST 17 0 - 37 U/L   ALT 20 0 - 35 U/L   Total Protein 6.7  6.0 - 8.3 g/dL   Albumin 4.5 3.5 - 5.2 g/dL  GFR 76.14 >60.00 mL/min   Calcium 9.7 8.4 - 10.5 mg/dL  Lipid panel  Result Value Ref Range   Cholesterol 240 (H) 0 - 200 mg/dL   Triglycerides 132.0 0.0 - 149.0 mg/dL   HDL 44.90 >39.00 mg/dL   VLDL 26.4 0.0 - 40.0 mg/dL   LDL Cholesterol 168 (H) 0 - 99 mg/dL   Total CHOL/HDL Ratio 5    NonHDL 194.88     Assessment and Plan  ETD (Eustachian tube dysfunction), bilateral Assessment & Plan:  No current infections but info given on S/S of bacterial superinfection.   She Has SE to prednisone and cannot use.  Continue flonase 2 sprays per nostril.  Start nasal saline spray or irrigation.  Start mucinex D  once, can stop if palpitations.  Can use ibuprofen 800 mg three times  a day for ear pain.  Call if new fever late in illness... if one ear is increasing to severe painful, one sided face pain. If not improving refer to ENT.      Return if symptoms worsen or fail to improve.   Eliezer Lofts, MD

## 2022-05-20 NOTE — Patient Instructions (Addendum)
Continue flonase 2 sprays per nostril.  Start nasal saline spray or irrigation.  Start mucinex  pr Claritin D  once, can stop if palpitations.  Can use ibuprofen 800 mg three times  a day for ear pain.  Call if new fever late in illness... if one ear is increasing to severe painful, one sided face pain. If not improving refer to ENT.

## 2022-05-20 NOTE — Assessment & Plan Note (Signed)
No current infections but info given on S/S of bacterial superinfection.   She Has SE to prednisone and cannot use.  Continue flonase 2 sprays per nostril.  Start nasal saline spray or irrigation.  Start mucinex D  once, can stop if palpitations.  Can use ibuprofen 800 mg three times  a day for ear pain.  Call if new fever late in illness... if one ear is increasing to severe painful, one sided face pain. If not improving refer to ENT.

## 2022-06-03 ENCOUNTER — Encounter: Payer: Self-pay | Admitting: Family Medicine

## 2022-06-04 ENCOUNTER — Ambulatory Visit
Admission: RE | Admit: 2022-06-04 | Discharge: 2022-06-04 | Disposition: A | Discharge status: Home / Self Care | Payer: 59 | Source: Ambulatory Visit | Attending: Family Medicine | Admitting: Family Medicine

## 2022-06-04 DIAGNOSIS — Z1231 Encounter for screening mammogram for malignant neoplasm of breast: Secondary | ICD-10-CM

## 2022-06-09 ENCOUNTER — Other Ambulatory Visit: Payer: Self-pay | Admitting: Family Medicine

## 2022-06-09 DIAGNOSIS — R928 Other abnormal and inconclusive findings on diagnostic imaging of breast: Secondary | ICD-10-CM

## 2022-06-21 ENCOUNTER — Ambulatory Visit
Admission: RE | Admit: 2022-06-21 | Discharge: 2022-06-21 | Disposition: A | Discharge status: Home / Self Care | Payer: 59 | Source: Ambulatory Visit | Attending: Family Medicine | Admitting: Family Medicine

## 2022-06-21 DIAGNOSIS — R928 Other abnormal and inconclusive findings on diagnostic imaging of breast: Secondary | ICD-10-CM

## 2022-09-14 ENCOUNTER — Ambulatory Visit: Payer: No Typology Code available for payment source | Admitting: Family Medicine

## 2022-09-14 ENCOUNTER — Encounter: Payer: Self-pay | Admitting: Family Medicine

## 2022-09-14 VITALS — BP 120/82 | HR 73 | Temp 98.0°F | Ht 64.0 in | Wt 223.0 lb

## 2022-09-14 DIAGNOSIS — R0789 Other chest pain: Secondary | ICD-10-CM | POA: Diagnosis not present

## 2022-09-14 LAB — CBC WITH DIFFERENTIAL/PLATELET
Basophils Absolute: 0 10*3/uL (ref 0.0–0.1)
Basophils Relative: 0.4 % (ref 0.0–3.0)
Eosinophils Absolute: 0.1 10*3/uL (ref 0.0–0.7)
Eosinophils Relative: 1.3 % (ref 0.0–5.0)
HCT: 45 % (ref 36.0–46.0)
Hemoglobin: 14.9 g/dL (ref 12.0–15.0)
Lymphocytes Relative: 27.6 % (ref 12.0–46.0)
Lymphs Abs: 1.7 10*3/uL (ref 0.7–4.0)
MCHC: 33.1 g/dL (ref 30.0–36.0)
MCV: 89.5 fl (ref 78.0–100.0)
Monocytes Absolute: 0.4 10*3/uL (ref 0.1–1.0)
Monocytes Relative: 7.1 % (ref 3.0–12.0)
Neutro Abs: 4 10*3/uL (ref 1.4–7.7)
Neutrophils Relative %: 63.6 % (ref 43.0–77.0)
Platelets: 313 10*3/uL (ref 150.0–400.0)
RBC: 5.03 Mil/uL (ref 3.87–5.11)
RDW: 14.4 % (ref 11.5–15.5)
WBC: 6.3 10*3/uL (ref 4.0–10.5)

## 2022-09-14 LAB — LIPASE: Lipase: 16 U/L (ref 11.0–59.0)

## 2022-09-14 MED ORDER — CYCLOBENZAPRINE HCL 5 MG PO TABS
5.0000 mg | ORAL_TABLET | Freq: Every day | ORAL | 0 refills | Status: DC
Start: 2022-09-14 — End: 2022-12-16

## 2022-09-14 NOTE — Assessment & Plan Note (Signed)
Acute, most consistent with musculoskeletal strain.  EKG shows normal sinus rhythm, no LVH, no ST changes, no Q wave Will evaluate with labs.  No clear sign of pulmonary infiltrate or infectious symptoms. Given pain radiates through to back will evaluate hepatic panel and lipase. Recommended ibuprofen, heat and gentle stretching. Can consider using muscle relaxant at night.

## 2022-09-14 NOTE — Patient Instructions (Addendum)
Please stop at the lab to have labs drawn.   Use tylenol or ibuprofen if tolerated for musculoskeletal pain. Apply heat, gentle range of motion of neck and left arm.  Simple chest wall stretches. Consider massage. Can use muscle relaxant at night if needed. Call if symptoms or not improving as expected or changing. If severe chest pain and shortness of breath go to the emergency room for further evaluation.

## 2022-09-14 NOTE — Progress Notes (Signed)
Patient ID: Sharon Chaney, female    DOB: [birth year], 57 y.o.   MRN: [id number]  This visit was conducted in person.  BP 120/82 (BP Location: Left Arm, Patient Position: Sitting, Cuff Size: Large)   Pulse 73   Temp 98 F (36.7 C) (Temporal)   Ht 5\' 4"  (1.626 m)   Wt 223 lb (101.2 kg)   LMP 02/10/2021   SpO2 99%   BMI 38.28 kg/m    CC:  Chief Complaint  Patient presents with   Chest Pain    Getting dressed this morning and had sharp pain in chest    Subjective:   HPI: Sharon Chaney is a 57 y.o. female presenting on 09/14/2022 for Chest Pain (Getting dressed this morning and had sharp pain in chest)   Feeling well in last few days.  She doe pick up grandaughter 45-58 Lbs 1 month old.    5 Am when she was getting dressed had sudden onset sharp apin in left upper chest through to back.  Felt lightheaded, dizziness, nausea, sweaty shortness of breath. Was hard to move left shoulder.  Pain improved gradually.. still some pain.   Pain with deep breathing. Has headaceh but has not had coffee or food/water yet.   No cough, congestion.   Recent Mammogram and [place] reviewed.    Relevant past medical, surgical, family and social history reviewed and updated as indicated. Interim medical history since our last visit reviewed. Allergies and medications reviewed and updated. Outpatient Medications Prior to Visit  Medication Sig Dispense Refill   acetaminophen (TYLENOL) 500 MG tablet Take 2 tablets (1,000 mg total) by mouth every 6 (six) hours as needed. 30 tablet 0   cholecalciferol (VITAMIN D) 1000 units tablet Take 1,000 Units by mouth in the morning.     COLLAGEN PO Take 1 Scoop by mouth daily.     Collagen-Boron-Hyaluronic Acid ([employer] JOINT HEALTH TRIPLE ACTION PO) Take 1 tablet by mouth in the morning.     fluticasone (FLONASE) 50 MCG/ACT nasal spray SPRAY 2 SPRAYS INTO EACH NOSTRIL EVERY DAY 16 mL 1   GLUCOSAMINE-CHONDROITIN PO Take 2 tablets by mouth daily.      loratadine (CLARITIN) 10 MG tablet Take 10 mg by mouth in the morning.     MAGNESIUM PO Take 2 tablets by mouth daily.     Omega-3 Fatty Acids (FISH OIL) 1200 MG CAPS Take 1 capsule by mouth daily.     omeprazole (PRILOSEC) 20 MG capsule Take 1 capsule (20 mg total) by mouth daily as needed (acid reflux/indigestion.). 90 capsule 3   TURMERIC PO Take 1 tablet by mouth daily.     Alpha Lipoic Acid 200 MG CAPS Take 200 mg by mouth in the morning.     ibuprofen (ADVIL) 200 MG tablet Take 400-600 mg by mouth every 8 (eight) hours as needed (pain.).     No facility-administered medications prior to visit.     Per HPI unless specifically indicated in ROS section below Review of Systems  Constitutional:  Negative for fatigue and fever.  HENT:  Negative for congestion.   Eyes:  Negative for pain.  Respiratory:  Negative for cough and shortness of breath.   Cardiovascular:  Positive for chest pain. Negative for palpitations and leg swelling.  Gastrointestinal:  Negative for abdominal pain.  Genitourinary:  Negative for dysuria and vaginal bleeding.  Musculoskeletal:  Negative for back pain.  Neurological:  Negative for syncope, light-headedness and headaches.  Psychiatric/Behavioral:  Negative  for dysphoric mood.    Objective:  BP 120/82 (BP Location: Left Arm, Patient Position: Sitting, Cuff Size: Large)   Pulse 73   Temp 98 F (36.7 C) (Temporal)   Ht 5\' 4"  (1.626 m)   Wt 223 lb (101.2 kg)   LMP 02/10/2021   SpO2 99%   BMI 38.28 kg/m   Wt Readings from Last 3 Encounters:  09/14/22 223 lb (101.2 kg)  05/20/22 215 lb 6 oz (97.7 kg)  08/21/21 221 lb 3 oz (100.3 kg)      Physical Exam Exam conducted with a chaperone present.  Constitutional:      General: She is not in acute distress.    Appearance: Normal appearance. She is well-developed. She is not ill-appearing or toxic-appearing.  HENT:     Head: Normocephalic.     Right Ear: Hearing, tympanic membrane, ear canal and external  ear normal. Tympanic membrane is not erythematous, retracted or bulging.     Left Ear: Hearing, tympanic membrane, ear canal and external ear normal. Tympanic membrane is not erythematous, retracted or bulging.     Nose: No mucosal edema or rhinorrhea.     Right Sinus: No maxillary sinus tenderness or frontal sinus tenderness.     Left Sinus: No maxillary sinus tenderness or frontal sinus tenderness.     Mouth/Throat:     Pharynx: Uvula midline.  Eyes:     General: Lids are normal. Lids are everted, no foreign bodies appreciated.     Conjunctiva/sclera: Conjunctivae normal.     Pupils: Pupils are equal, round, and reactive to light.  Neck:     Thyroid: No thyroid mass or thyromegaly.     Vascular: No carotid bruit.     Trachea: Trachea normal.  Cardiovascular:     Rate and Rhythm: Normal rate and regular rhythm.     Pulses: Normal pulses.     Heart sounds: Normal heart sounds, S1 normal and S2 normal. No murmur heard.    No friction rub. No gallop.  Pulmonary:     Effort: Pulmonary effort is normal. No tachypnea or respiratory distress.     Breath sounds: Normal breath sounds. No decreased breath sounds, wheezing, rhonchi or rales.  Chest:     Chest wall: Tenderness present. No mass, deformity or swelling.  Breasts:    Left: No tenderness.  Abdominal:     General: Bowel sounds are normal.     Palpations: Abdomen is soft.     Tenderness: There is no abdominal tenderness.  Musculoskeletal:     Left shoulder: Tenderness present. No bony tenderness. Decreased range of motion.     Cervical back: Normal range of motion and neck supple.     Right lower leg: No edema.     Left lower leg: No edema.     Comments: Positive empty can, positive crossover test,: Both likely positive given diffuse muscular tenderness in left trapezius and anterior chest wall  Skin:    General: Skin is warm and dry.     Findings: No rash.  Neurological:     Mental Status: She is alert.  Psychiatric:         Mood and Affect: Mood is not anxious or depressed.        Speech: Speech normal.        Behavior: Behavior normal. Behavior is cooperative.        Thought Content: Thought content normal.        Judgment: Judgment normal.  Results for orders placed or performed in visit on 08/21/21  Hepatitis C antibody  Result Value Ref Range   Hepatitis C Ab NON-REACTIVE NON-REACTIVE   SIGNAL TO CUT-OFF 0.10 <1.00  Comprehensive metabolic panel  Result Value Ref Range   Sodium 143 135 - 145 mEq/L   Potassium 4.5 3.5 - 5.1 mEq/L   Chloride 106 96 - 112 mEq/L   CO2 30 19 - 32 mEq/L   Glucose, Bld 99 70 - 99 mg/dL   BUN 6 6 - 23 mg/dL   Creatinine, Ser 4.09 0.40 - 1.20 mg/dL   Total Bilirubin 0.5 0.2 - 1.2 mg/dL   Alkaline Phosphatase 53 39 - 117 U/L   AST 17 0 - 37 U/L   ALT 20 0 - 35 U/L   Total Protein 6.7 6.0 - 8.3 g/dL   Albumin 4.5 3.5 - 5.2 g/dL   GFR 81.19 >14.78 mL/min   Calcium 9.7 8.4 - 10.5 mg/dL  Lipid panel  Result Value Ref Range   Cholesterol 240 (H) 0 - 200 mg/dL   Triglycerides 295.6 0.0 - 149.0 mg/dL   HDL 21.30 >86.57 mg/dL   VLDL 84.6 0.0 - 96.2 mg/dL   LDL Cholesterol 952 (H) 0 - 99 mg/dL   Total CHOL/HDL Ratio 5    NonHDL 194.88     Assessment and Plan  Other chest pain -     EKG 12-Lead  Atypical chest pain Assessment & Plan: Acute, most consistent with musculoskeletal strain.  EKG shows normal sinus rhythm, no LVH, no ST changes, no Q wave Will evaluate with labs.  No clear sign of pulmonary infiltrate or infectious symptoms. Given pain radiates through to back will evaluate hepatic panel and lipase. Recommended ibuprofen, heat and gentle stretching. Can consider using muscle relaxant at night.   Orders: -     Comprehensive metabolic panel; Future -     Lipase -     CBC with Differential/Platelet  Other orders -     Cyclobenzaprine HCl; Take 1 tablet (5 mg total) by mouth at bedtime.  Dispense: 15 tablet; Refill: 0    No follow-ups on file.    Kerby Nora, MD

## 2022-10-12 ENCOUNTER — Encounter: Payer: Self-pay | Admitting: Family Medicine

## 2022-12-16 ENCOUNTER — Ambulatory Visit: Payer: No Typology Code available for payment source | Admitting: Family Medicine

## 2022-12-16 ENCOUNTER — Encounter: Payer: Self-pay | Admitting: Family Medicine

## 2022-12-16 VITALS — BP 120/80 | HR 91 | Temp 98.4°F | Ht 64.0 in | Wt 224.5 lb

## 2022-12-16 DIAGNOSIS — J0100 Acute maxillary sinusitis, unspecified: Secondary | ICD-10-CM

## 2022-12-16 MED ORDER — FLUCONAZOLE 150 MG PO TABS
ORAL_TABLET | ORAL | 0 refills | Status: DC
Start: 2022-12-16 — End: 2023-02-01

## 2022-12-16 MED ORDER — AMOXICILLIN-POT CLAVULANATE 875-125 MG PO TABS: 1.0000 | ORAL_TABLET | Freq: Two times a day (BID) | ORAL | 0 refills | Status: DC

## 2022-12-16 NOTE — Progress Notes (Signed)
Sharon Kosiba T. Anuoluwapo Mefferd, MD, CAQ Sports Medicine [facility] [address] [place], [address]  Phone: [phone]  FAX: [phone]  Sharon Chaney - 57 y.o. female  MRN [id number]  Date of Birth: 11-Aug-1965  Date: 12/16/2022  PCP: Excell Seltzer, MD  Referral: Excell Seltzer, MD  Chief Complaint  Patient presents with   Headache    Symptoms started on Sunday Negative Covid test this morning   Cough    Dry   Facial Pain        Subjective:   Sharon Chaney is a 57 y.o. very pleasant female patient with Body mass index is 38.54 kg/m. who presents with the following:  Started on Sunday, and felt like someone is hitting her in the back.  A different sort of tired and a headache since Monday.  She has tenderness in the left frontal and maxillary sinus region.  She has a lot of nasal congestion.  No fever, chills, sweats Coughing a dry cough Having some stomach ache  No n/v/d - some diarrhea this morning  Only been eating soup.    Review of Systems is noted in the HPI, as appropriate  Objective:   BP 120/80 (BP Location: Left Arm, Patient Position: Sitting, Cuff Size: Large)   Pulse 91   Temp 98.4 F (36.9 C) (Temporal)   Ht 5\' 4"  (1.626 m)   Wt 224 lb 8 oz (101.8 kg)   LMP 02/10/2021   SpO2 98%   BMI 38.54 kg/m    Gen: WDWN, NAD; alert,appropriate and cooperative throughout exam  HEENT: Normocephalic and atraumatic. Throat clear, w/o exudate, no LAD, R TM clear, L TM - good landmarks, No fluid present. rhinnorhea.  Left frontal and maxillary sinuses: Tender Right frontal and maxillary sinuses: non-Tender  Neck: No ant or post LAD CV: RRR, No M/G/R Pulm: Breathing comfortably in no resp distress. no w/c/r Psych: full affect, pleasant   Laboratory and Imaging Data:  Assessment and Plan:     ICD-10-CM   1. Acute non-recurrent maxillary sinusitis  J01.00      Left sided sinusitis.  Continue with basic  supportive care at home, and add antibiotics.  Medication Management during today's office visit: Meds ordered this encounter  Medications   amoxicillin-clavulanate (AUGMENTIN) 875-125 MG tablet    Sig: Take 1 tablet by mouth 2 (two) times daily.    Dispense:  20 tablet    Refill:  0   fluconazole (DIFLUCAN) 150 MG tablet    Sig: Take 1 tab po today, repeat in 7 days if needed    Dispense:  2 tablet    Refill:  0   Medications Discontinued During This Encounter  Medication Reason   cyclobenzaprine (FLEXERIL) 5 MG tablet Completed Course    Orders placed today for conditions managed today: No orders of the defined types were placed in this encounter.   Disposition: No follow-ups on file.  [employer] One speech-to-text software was used for transcription in this dictation.  Possible transcriptional errors can occur using Animal nutritionist.   Signed,  Elpidio Galea. Shironda Kain, MD   Outpatient Encounter Medications as of 12/16/2022  Medication Sig   acetaminophen (TYLENOL) 500 MG tablet Take 2 tablets (1,000 mg total) by mouth every 6 (six) hours as needed.   amoxicillin-clavulanate (AUGMENTIN) 875-125 MG tablet Take 1 tablet by mouth 2 (two) times daily.   cholecalciferol (VITAMIN D) 1000 units tablet Take 1,000 Units by mouth  in the morning.   COLLAGEN PO Take 1 Scoop by mouth daily.   Collagen-Boron-Hyaluronic Acid ([employer] JOINT HEALTH TRIPLE ACTION PO) Take 1 tablet by mouth in the morning.   fluconazole (DIFLUCAN) 150 MG tablet Take 1 tab po today, repeat in 7 days if needed   fluticasone (FLONASE) 50 MCG/ACT nasal spray SPRAY 2 SPRAYS INTO EACH NOSTRIL EVERY DAY   GLUCOSAMINE-CHONDROITIN PO Take 2 tablets by mouth daily.   loratadine (CLARITIN) 10 MG tablet Take 10 mg by mouth in the morning.   MAGNESIUM PO Take 2 tablets by mouth daily.   Omega-3 Fatty Acids (FISH OIL) 1200 MG CAPS Take 1 capsule by mouth daily.   omeprazole (PRILOSEC) 20 MG capsule Take 1 capsule (20 mg  total) by mouth daily as needed (acid reflux/indigestion.).   TURMERIC PO Take 1 tablet by mouth daily.   [DISCONTINUED] cyclobenzaprine (FLEXERIL) 5 MG tablet Take 1 tablet (5 mg total) by mouth at bedtime.   No facility-administered encounter medications on file as of 12/16/2022.

## 2023-01-28 ENCOUNTER — Encounter: Payer: No Typology Code available for payment source | Admitting: Family Medicine

## 2023-02-01 ENCOUNTER — Other Ambulatory Visit: Payer: Self-pay | Admitting: Family Medicine

## 2023-02-01 ENCOUNTER — Ambulatory Visit (INDEPENDENT_AMBULATORY_CARE_PROVIDER_SITE_OTHER)
Admission: RE | Admit: 2023-02-01 | Discharge: 2023-02-01 | Disposition: A | Discharge status: Home / Self Care | Payer: No Typology Code available for payment source | Source: Ambulatory Visit | Attending: Family Medicine | Admitting: Family Medicine

## 2023-02-01 ENCOUNTER — Ambulatory Visit: Payer: No Typology Code available for payment source | Admitting: Family Medicine

## 2023-02-01 VITALS — BP 118/76 | HR 77 | Temp 98.4°F | Ht 64.0 in | Wt 224.0 lb

## 2023-02-01 DIAGNOSIS — M25562 Pain in left knee: Secondary | ICD-10-CM | POA: Diagnosis not present

## 2023-02-01 DIAGNOSIS — Z23 Encounter for immunization: Secondary | ICD-10-CM

## 2023-02-01 MED ORDER — MELOXICAM 15 MG PO TABS: 15.0000 mg | ORAL_TABLET | Freq: Every day | ORAL | 0 refills | Status: DC

## 2023-02-01 NOTE — Progress Notes (Signed)
Patient ID: Sharon Chaney, female    DOB: [birth year], 57 y.o.   MRN: [id number]  This visit was conducted in person.  BP 118/76   Pulse 77   Temp 98.4 F (36.9 C) (Oral)   Ht 5\' 4"  (1.626 m)   Wt 224 lb (101.6 kg)   LMP 02/10/2021   SpO2 98%   BMI 38.45 kg/m    CC:  Chief Complaint  Patient presents with   Knee Pain    Left knee increased over time.     Subjective:   HPI: Sharon Chaney is a 57 y.o. female  with obesity presenting on 02/01/2023 for Knee Pain (Left knee increased over time. )  Left knee pain, ongoing x months, worse in last week.  No change in activity, no fall.   Occ swelling, no redness.   She has also noted left  buttock pain, left thigh pain ongoing x months.  Left leg feels weak, no numbness in leg.  No fever, no incontinence.   Using tylenol 2 tabs every 6 hours.. helps a little.  Using tumeric as well.       Relevant past medical, surgical, family and social history reviewed and updated as indicated. Interim medical history since our last visit reviewed. Allergies and medications reviewed and updated. Outpatient Medications Prior to Visit  Medication Sig Dispense Refill   acetaminophen (TYLENOL) 500 MG tablet Take 2 tablets (1,000 mg total) by mouth every 6 (six) hours as needed. 30 tablet 0   cholecalciferol (VITAMIN D) 1000 units tablet Take 1,000 Units by mouth in the morning.     COLLAGEN PO Take 1 Scoop by mouth daily.     Collagen-Boron-Hyaluronic Acid ([employer] JOINT HEALTH TRIPLE ACTION PO) Take 1 tablet by mouth in the morning.     fluticasone (FLONASE) 50 MCG/ACT nasal spray SPRAY 2 SPRAYS INTO EACH NOSTRIL EVERY DAY 16 mL 1   GLUCOSAMINE-CHONDROITIN PO Take 2 tablets by mouth daily.     loratadine (CLARITIN) 10 MG tablet Take 10 mg by mouth in the morning.     MAGNESIUM PO Take 2 tablets by mouth daily.     Omega-3 Fatty Acids (FISH OIL) 1200 MG CAPS Take 1 capsule by mouth daily.     omeprazole (PRILOSEC) 20 MG capsule Take  1 capsule (20 mg total) by mouth daily as needed (acid reflux/indigestion.). 90 capsule 3   TURMERIC PO Take 1 tablet by mouth daily.     amoxicillin-clavulanate (AUGMENTIN) 875-125 MG tablet Take 1 tablet by mouth 2 (two) times daily. 20 tablet 0   fluconazole (DIFLUCAN) 150 MG tablet Take 1 tab po today, repeat in 7 days if needed 2 tablet 0   No facility-administered medications prior to visit.     Per HPI unless specifically indicated in ROS section below Review of Systems  Constitutional:  Negative for fatigue and fever.  HENT:  Negative for congestion.   Eyes:  Negative for pain.  Respiratory:  Negative for cough and shortness of breath.   Cardiovascular:  Negative for chest pain, palpitations and leg swelling.  Gastrointestinal:  Negative for abdominal pain.  Genitourinary:  Negative for dysuria and vaginal bleeding.  Musculoskeletal:  Negative for back pain.  Neurological:  Negative for syncope, light-headedness and headaches.  Psychiatric/Behavioral:  Negative for dysphoric mood.    Objective:  BP 118/76   Pulse 77   Temp 98.4 F (36.9 C) (Oral)   Ht 5\' 4"  (1.626 m)   Wt 224  lb (101.6 kg)   LMP 02/10/2021   SpO2 98%   BMI 38.45 kg/m   Wt Readings from Last 3 Encounters:  02/01/23 224 lb (101.6 kg)  12/16/22 224 lb 8 oz (101.8 kg)  09/14/22 223 lb (101.2 kg)      Physical Exam Constitutional:      General: She is not in acute distress.    Appearance: Normal appearance. She is well-developed. She is not ill-appearing or toxic-appearing.  HENT:     Head: Normocephalic.     Right Ear: Hearing, tympanic membrane, ear canal and external ear normal. Tympanic membrane is not erythematous, retracted or bulging.     Left Ear: Hearing, tympanic membrane, ear canal and external ear normal. Tympanic membrane is not erythematous, retracted or bulging.     Nose: No mucosal edema or rhinorrhea.     Right Sinus: No maxillary sinus tenderness or frontal sinus tenderness.      Left Sinus: No maxillary sinus tenderness or frontal sinus tenderness.     Mouth/Throat:     Mouth: Oropharynx is clear and moist and mucous membranes are normal.     Pharynx: Uvula midline.  Eyes:     General: Lids are normal. Lids are everted, no foreign bodies appreciated.     Extraocular Movements: EOM normal.     Conjunctiva/sclera: Conjunctivae normal.     Pupils: Pupils are equal, round, and reactive to light.  Neck:     Thyroid: No thyroid mass or thyromegaly.     Vascular: No carotid bruit.     Trachea: Trachea normal.  Cardiovascular:     Rate and Rhythm: Normal rate and regular rhythm.     Pulses: Normal pulses.     Heart sounds: Normal heart sounds, S1 normal and S2 normal. No murmur heard.    No friction rub. No gallop.  Pulmonary:     Effort: Pulmonary effort is normal. No tachypnea or respiratory distress.     Breath sounds: Normal breath sounds. No decreased breath sounds, wheezing, rhonchi or rales.  Abdominal:     General: Bowel sounds are normal.     Palpations: Abdomen is soft.     Tenderness: There is no abdominal tenderness.  Musculoskeletal:     Cervical back: Normal range of motion and neck supple.     Lumbar back: Tenderness present. No spasms or bony tenderness. Normal range of motion. Negative right straight leg raise test and negative left straight leg raise test.     Right hip: Tenderness present. Decreased range of motion.     Left hip: Tenderness present. No bony tenderness. Decreased range of motion.     Left knee: Swelling present. No erythema or bony tenderness. Normal range of motion. Tenderness present over the medial joint line and lateral joint line. Normal alignment, normal meniscus and normal patellar mobility.     Instability Tests: Anterior drawer test negative. Posterior drawer test negative. Anterior Lachman test negative. Medial McMurray test negative and lateral McMurray test negative.  Skin:    General: Skin is warm, dry and intact.      Findings: No rash.  Neurological:     Mental Status: She is alert.  Psychiatric:        Mood and Affect: Mood is not anxious or depressed.        Speech: Speech normal.        Behavior: Behavior normal. Behavior is cooperative.        Thought Content: Thought content normal.  Cognition and Memory: Cognition and memory normal.        Judgment: Judgment normal.       Results for orders placed or performed in visit on 09/14/22  Lipase  Result Value Ref Range   Lipase 16.0 11.0 - 59.0 U/L  CBC with Differential/Platelet  Result Value Ref Range   WBC 6.3 4.0 - 10.5 K/uL   RBC 5.03 3.87 - 5.11 Mil/uL   Hemoglobin 14.9 12.0 - 15.0 g/dL   HCT 40.9 81.1 - 91.4 %   MCV 89.5 78.0 - 100.0 fl   MCHC 33.1 30.0 - 36.0 g/dL   RDW 78.2 95.6 - 21.3 %   Platelets 313.0 150.0 - 400.0 K/uL   Neutrophils Relative % 63.6 43.0 - 77.0 %   Lymphocytes Relative 27.6 12.0 - 46.0 %   Monocytes Relative 7.1 3.0 - 12.0 %   Eosinophils Relative 1.3 0.0 - 5.0 %   Basophils Relative 0.4 0.0 - 3.0 %   Neutro Abs 4.0 1.4 - 7.7 K/uL   Lymphs Abs 1.7 0.7 - 4.0 K/uL   Monocytes Absolute 0.4 0.1 - 1.0 K/uL   Eosinophils Absolute 0.1 0.0 - 0.7 K/uL   Basophils Absolute 0.0 0.0 - 0.1 K/uL    Assessment and Plan  Acute pain of left knee Assessment & Plan:  Acute, most likely osteoarthritis versus meniscal tear.  Will evaluate with x-ray given ongoing for quite some time. Will treat with meloxicam 15 mg p.o. daily given history of reflux.  Ice and elevate can wear brace when up on feet.  She also mentions some thigh and buttock pain but on exam there is no clear evidence of sciatica.  This pain may be more musculoskeletal given antalgic gait with knee pain.  Orders: -     DG Knee Complete 4 Views Left; Future  Other orders -     Meloxicam; Take 1 tablet (15 mg total) by mouth daily.  Dispense: 30 tablet; Refill: 0    No follow-ups on file.   Kerby Nora, MD

## 2023-02-01 NOTE — Patient Instructions (Addendum)
Ice, elevate left knee.  We will call with X-ray results.  Continue glucosamine and turmeric.  Start Meloxicam once daily for 2-4 weeks.

## 2023-02-01 NOTE — Assessment & Plan Note (Signed)
Acute, most likely osteoarthritis versus meniscal tear.  Will evaluate with x-ray given ongoing for quite some time. Will treat with meloxicam 15 mg p.o. daily given history of reflux.  Ice and elevate can wear brace when up on feet.  She also mentions some thigh and buttock pain but on exam there is no clear evidence of sciatica.  This pain may be more musculoskeletal given antalgic gait with knee pain.

## 2023-02-17 ENCOUNTER — Telehealth: Payer: Self-pay | Admitting: Family Medicine

## 2023-02-17 ENCOUNTER — Encounter: Payer: Self-pay | Admitting: Family Medicine

## 2023-02-17 DIAGNOSIS — M25562 Pain in left knee: Secondary | ICD-10-CM

## 2023-02-17 NOTE — Telephone Encounter (Signed)
X-ray was done over 2 weeks ago.  Can you help and see if we can't get this read.

## 2023-02-17 NOTE — Telephone Encounter (Signed)
Pt called in and stated that she has not heard anything about her X-rays that was done on 10/8 done on here knee and leg pt would like a call back as soon as possible [phone]

## 2023-02-23 LAB — HM DEXA SCAN: HM Dexa Scan: NORMAL

## 2023-03-03 NOTE — Telephone Encounter (Signed)
Patient called in wanting to know what would be her treatment options possibly with Dr Patsy Lager or orthopedics.She's trying to make a decision as to which route to go,but she is wondering which one would be the most beneficial to her,because eventually she said that she would like to have a MRI done.Marland Kitchen

## 2023-03-04 ENCOUNTER — Encounter: Payer: Self-pay | Admitting: *Deleted

## 2023-04-05 NOTE — Progress Notes (Unsigned)
    Editha Bridgeforth T. Brantleigh Mifflin, MD, CAQ Sports Medicine [facility] at [facility] [address] [place], [address]  Phone: [phone]  FAX: [phone]  KAYLYNNE OHAVER - 57 y.o. female  MRN [id number]  Date of Birth: June 10, 1965  Date: 04/06/2023  PCP: Excell Seltzer, MD  Referral: Excell Seltzer, MD  No chief complaint on file.  Subjective:   Creta MURIEL BABERS is a 57 y.o. very pleasant female patient with There is no height or weight on file to calculate BMI. who presents with the following:  She is a very pleasant young lady of 40, she presents with some ongoing acute illness.    Review of Systems is noted in the HPI, as appropriate  Objective:   LMP 02/10/2021   GEN: No acute distress; alert,appropriate. PULM: Breathing comfortably in no respiratory distress PSYCH: Normally interactive.   Laboratory and Imaging Data:  Assessment and Plan:   ***

## 2023-04-06 ENCOUNTER — Ambulatory Visit: Payer: No Typology Code available for payment source | Admitting: Family Medicine

## 2023-04-06 ENCOUNTER — Encounter: Payer: Self-pay | Admitting: Family Medicine

## 2023-04-06 VITALS — BP 120/72 | HR 74 | Temp 98.2°F | Ht 64.0 in | Wt 227.1 lb

## 2023-04-06 DIAGNOSIS — R062 Wheezing: Secondary | ICD-10-CM

## 2023-04-06 DIAGNOSIS — J208 Acute bronchitis due to other specified organisms: Secondary | ICD-10-CM

## 2023-04-06 MED ORDER — DOXYCYCLINE HYCLATE 100 MG PO TABS: 100.0000 mg | ORAL_TABLET | Freq: Two times a day (BID) | ORAL | 0 refills | Status: DC

## 2023-04-26 ENCOUNTER — Encounter (HOSPITAL_COMMUNITY): Payer: Self-pay

## 2023-04-26 ENCOUNTER — Other Ambulatory Visit: Payer: Self-pay

## 2023-04-26 ENCOUNTER — Emergency Department (HOSPITAL_COMMUNITY): Payer: No Typology Code available for payment source

## 2023-04-26 ENCOUNTER — Emergency Department (HOSPITAL_COMMUNITY)
Admission: EM | Admit: 2023-04-26 | Discharge: 2023-04-27 | Disposition: A | Discharge status: Home / Self Care | Payer: No Typology Code available for payment source | Attending: Emergency Medicine | Admitting: Emergency Medicine

## 2023-04-26 DIAGNOSIS — R61 Generalized hyperhidrosis: Secondary | ICD-10-CM | POA: Insufficient documentation

## 2023-04-26 DIAGNOSIS — Z91040 Latex allergy status: Secondary | ICD-10-CM | POA: Insufficient documentation

## 2023-04-26 DIAGNOSIS — E876 Hypokalemia: Secondary | ICD-10-CM | POA: Diagnosis not present

## 2023-04-26 DIAGNOSIS — R42 Dizziness and giddiness: Secondary | ICD-10-CM | POA: Insufficient documentation

## 2023-04-26 DIAGNOSIS — R531 Weakness: Secondary | ICD-10-CM | POA: Insufficient documentation

## 2023-04-26 DIAGNOSIS — R55 Syncope and collapse: Secondary | ICD-10-CM | POA: Insufficient documentation

## 2023-04-26 LAB — CBC WITH DIFFERENTIAL/PLATELET
Abs Immature Granulocytes: 0.03 10*3/uL (ref 0.00–0.07)
Basophils Absolute: 0 10*3/uL (ref 0.0–0.1)
Basophils Relative: 0 %
Eosinophils Absolute: 0.1 10*3/uL (ref 0.0–0.5)
Eosinophils Relative: 2 %
HCT: 43.3 % (ref 36.0–46.0)
Hemoglobin: 14.8 g/dL (ref 12.0–15.0)
Immature Granulocytes: 0 %
Lymphocytes Relative: 20 %
Lymphs Abs: 1.9 10*3/uL (ref 0.7–4.0)
MCH: 30.8 pg (ref 26.0–34.0)
MCHC: 34.2 g/dL (ref 30.0–36.0)
MCV: 90 fL (ref 80.0–100.0)
Monocytes Absolute: 0.6 10*3/uL (ref 0.1–1.0)
Monocytes Relative: 6 %
Neutro Abs: 6.9 10*3/uL (ref 1.7–7.7)
Neutrophils Relative %: 72 %
Platelets: 262 10*3/uL (ref 150–400)
RBC: 4.81 MIL/uL (ref 3.87–5.11)
RDW: 14.3 % (ref 11.5–15.5)
WBC: 9.5 10*3/uL (ref 4.0–10.5)
nRBC: 0 % (ref 0.0–0.2)

## 2023-04-26 LAB — COMPREHENSIVE METABOLIC PANEL
ALT: 20 U/L (ref 0–44)
AST: 17 U/L (ref 15–41)
Albumin: 4.1 g/dL (ref 3.5–5.0)
Alkaline Phosphatase: 53 U/L (ref 38–126)
Anion gap: 7 (ref 5–15)
BUN: 16 mg/dL (ref 6–20)
CO2: 25 mmol/L (ref 22–32)
Calcium: 9.4 mg/dL (ref 8.9–10.3)
Chloride: 103 mmol/L (ref 98–111)
Creatinine, Ser: 0.95 mg/dL (ref 0.44–1.00)
GFR, Estimated: >60 mL/min (ref >60)
Glucose, Bld: 138 mg/dL — ABNORMAL HIGH (ref 70–99)
Potassium: 3.4 mmol/L — ABNORMAL LOW (ref 3.5–5.1)
Sodium: 135 mmol/L (ref 135–145)
Total Bilirubin: 0.4 mg/dL (ref 0.0–1.2)
Total Protein: 7 g/dL (ref 6.5–8.1)

## 2023-04-26 LAB — TROPONIN I (HIGH SENSITIVITY): Troponin I (High Sensitivity): 3 ng/L (ref <18)

## 2023-04-26 NOTE — ED Triage Notes (Signed)
Pt BIB EMS from [facility]. Upon EMS arrival pt was pale and diaphoretic. Pt complains of dizziness, pt had bronchitis 2 weeks ago.

## 2023-04-26 NOTE — ED Provider Triage Note (Signed)
 Emergency Medicine Provider Triage Evaluation Note  Sharon Chaney , a 57 y.o. female  was evaluated in triage.  Patient brought to ED by EMS from [facility].  She was pale, diaphoretic, and complaining of lightheadedness.  Patient had near syncopal episode, but did not lose consciousness.  Currently, she still feels unwell and has discomfort to her right chest.  Denies vomiting, diarrhea, fever.   Review of Systems  Positive: As above Negative: As above  Physical Exam  LMP 02/10/2021  Gen:   Awake, no distress   Resp:  Normal effort  MSK:   Moves extremities without difficulty  Other:  Patient is ill-appearing, somewhat pale  Medical Decision Making  Medically screening exam initiated at 6:39 PM.  Appropriate orders placed.  Sharon Chaney was informed that the remainder of the evaluation will be completed by another provider, this initial triage assessment does not replace that evaluation, and the importance of remaining in the ED until their evaluation is complete.     Gretta Gerard SAUNDERS, [place] 04/26/23 1840

## 2023-04-27 LAB — TROPONIN I (HIGH SENSITIVITY): Troponin I (High Sensitivity): <2 ng/L (ref <18)

## 2023-04-27 NOTE — Discharge Instructions (Signed)
 You have been seen today for your complaint of near syncope. Your lab work was reassuring. Your imaging was reassuring. Home care instructions are as follows:  Drink plenty of water.  Eat a normal diet Follow up with: Primary care provider as soon as possible Please seek immediate medical care if you develop any of the following symptoms: You faint. You have any of these symptoms that may indicate trouble with your heart: Fast or irregular heartbeats (palpitations). Unusual pain in your chest, abdomen, or back. Shortness of breath. You have a seizure. You have a severe headache. You are confused. You have vision problems. You have severe weakness or trouble walking. You are bleeding from your mouth or rectum, or have black or tarry stool. At this time there does not appear to be the presence of an emergent medical condition, however there is always the potential for conditions to change. Please read and follow the below instructions.  Do not take your medicine if  develop an itchy rash, swelling in your mouth or lips, or difficulty breathing; call 911 and seek immediate emergency medical attention if this occurs.  You may review your lab tests and imaging results in their entirety on your MyChart account.  Please discuss all results of fully with your primary care provider and other specialist at your follow-up visit.  Note: Portions of this text may have been transcribed using voice recognition software. Every effort was made to ensure accuracy; however, inadvertent computerized transcription errors may still be present.

## 2023-04-27 NOTE — ED Provider Notes (Signed)
 [place] EMERGENCY DEPARTMENT AT [facility] Provider Note   CSN: [id number] Arrival date & time: 04/26/23  1814     History  Chief Complaint  Patient presents with   Dizziness    Sharon Chaney is a 58 y.o. female.  With history of endometriosis presenting to the ED for evaluation of near syncope.  She reports she was sitting at a restaurant when she developed sudden onset lightheadedness and diaphoresis.  She reports multiple near syncopal episodes but did not ever actually lose consciousness.  Unfortunately, patient had a long wait time of greater than 10 hours.  On my assessment, she reports feeling tired and slightly weak but denies any additional near syncopal episodes.  She reports intermittent mild right sided chest pains that she states typically occur when she is tired and are nothing new to her.  She reports a mild intermittent cough due to recent bronchitis.  She denies hemoptysis.  She denies unilateral leg swelling, history of DVT or PE, recent cancer treatments, surgeries, long distance travel, exogenous estrogen use.  She denies any fevers or chills.  She reports eating a normal diet.  No nausea, vomiting or diarrhea.   Dizziness Associated symptoms: weakness        Home Medications Prior to Admission medications   Medication Sig Start Date End Date Taking? Authorizing Provider  acetaminophen  (TYLENOL ) 500 MG tablet Take 2 tablets (1,000 mg total) by mouth every 6 (six) hours as needed. 04/27/15   Eldridge, Serena Y, PA-C  cholecalciferol (VITAMIN D ) 1000 units tablet Take 1,000 Units by mouth in the morning.    [provider]  COLLAGEN PO Take 1 Scoop by mouth daily.    [provider]  Collagen-Boron-Hyaluronic Acid ([employer] JOINT HEALTH TRIPLE ACTION PO) Take 1 tablet by mouth in the morning.    [provider]  doxycycline  (VIBRA -TABS) 100 MG tablet Take 1 tablet (100 mg total) by mouth 2 (two) times daily. 04/06/23   Copland,  Jacques, MD  fluticasone  (FLONASE ) 50 MCG/ACT nasal spray SPRAY 2 SPRAYS INTO EACH NOSTRIL EVERY DAY 07/09/19   Saguier, [place], PA-C  GLUCOSAMINE-CHONDROITIN PO Take 2 tablets by mouth daily.    [provider]  loratadine (CLARITIN) 10 MG tablet Take 10 mg by mouth in the morning.    [provider]  MAGNESIUM  PO Take 2 tablets by mouth daily.    [provider]  meloxicam  (MOBIC ) 15 MG tablet Take 1 tablet (15 mg total) by mouth daily. 02/01/23   Bedsole, Amy E, MD  Omega-3 Fatty Acids (FISH OIL) 1200 MG CAPS Take 1 capsule by mouth daily.    [provider]  omeprazole  (PRILOSEC ) 20 MG capsule Take 1 capsule (20 mg total) by mouth daily as needed (acid reflux/indigestion.). 08/21/21   Bedsole, Amy E, MD  TURMERIC PO Take 1 tablet by mouth daily.    [provider]      Allergies    Lactose intolerance (gi), Promethazine, Latex, and Tomato    Review of Systems   Review of Systems  Neurological:  Positive for weakness and light-headedness.  All other systems reviewed and are negative.   Physical Exam Updated Vital Signs BP (!) 156/86 (BP Location: Right Arm)   Pulse 65   Temp (!) 97.5 F (36.4 C) (Oral)   Resp 18   Ht 5' 4 (1.626 m)   Wt 101.2 kg   LMP 02/10/2021   SpO2 98%   BMI 38.28 kg/m  Physical  Exam Vitals and nursing note reviewed.  Constitutional:      General: She is not in acute distress.    Appearance: She is well-developed.     Comments: Resting comfortably in chair  HENT:     Head: Normocephalic and atraumatic.  Eyes:     Conjunctiva/sclera: Conjunctivae normal.  Cardiovascular:     Rate and Rhythm: Normal rate and regular rhythm.     Heart sounds: No murmur heard. Pulmonary:     Effort: Pulmonary effort is normal. No respiratory distress.     Breath sounds: Normal breath sounds. No wheezing, rhonchi or rales.  Abdominal:     Palpations: Abdomen is soft.     Tenderness: There is no abdominal tenderness. There  is no guarding.  Musculoskeletal:        General: No swelling.     Cervical back: Neck supple.  Skin:    General: Skin is warm and dry.     Capillary Refill: Capillary refill takes less than 2 seconds.  Neurological:     Mental Status: She is alert.  Psychiatric:        Mood and Affect: Mood normal.     ED Results / Procedures / Treatments   Labs (all labs ordered are listed, but only abnormal results are displayed) Labs Reviewed  COMPREHENSIVE METABOLIC PANEL - Abnormal; Notable for the following components:      Result Value   Potassium 3.4 (*)    Glucose, Bld 138 (*)    All other components within normal limits  CBC WITH DIFFERENTIAL/PLATELET  TROPONIN I (HIGH SENSITIVITY)  TROPONIN I (HIGH SENSITIVITY)    EKG None  Radiology DG Chest 2 View Result Date: 04/26/2023 CLINICAL DATA:  Dizziness and diaphoresis EXAM: CHEST - 2 VIEW COMPARISON:  05/10/2020 FINDINGS: The heart size and mediastinal contours are within normal limits. Both lungs are clear. The visualized skeletal structures are unremarkable. IMPRESSION: No active cardiopulmonary disease. Electronically Signed   By: Oneil Devonshire M.D.   On: 04/26/2023 21:03    Procedures Procedures    Medications Ordered in ED Medications - No data to display  ED Course/ Medical Decision Making/ A&P                             Geneva (Revised) Score: 0, Geneva Score Interpretation: Low Risk Group: 7-9% incidence of pulmonary embolism from several studies   Medical Decision Making This patient presents to the ED for concern of near syncope, this involves an extensive number of treatment options, and is a complaint that carries with it a high risk of complications and morbidity. The differential for syncope is extensive and includes, but is not limited to: arrythmia (Vtach, SVT, SSS, sinus arrest, AV block, bradycardia) aortic stenosis, AMI, HOCM, PE, atrial myxoma, pulmonary hypertension, orthostatic hypotension, (hypovolemia,  drug effect, GB syndrome, micturition, cough, swall) carotid sinus sensitivity, Seizure, TIA/CVA, hypoglycemia,  Vertigo.   My initial workup includes labs, EKG, imaging  Additional history obtained from: Nursing notes from this visit. EMS provides a portion of the history  I ordered, reviewed and interpreted labs which include: CBC, CMP, troponin, delta troponin.  Mild hypokalemia of 3.4.  Labs otherwise within normal limits.  I ordered imaging studies including chest x-ray I independently visualized and interpreted imaging which showed normal I agree with the radiologist interpretation  Afebrile, hemodynamically stable.  58 year old female presenting for evaluation of near syncope.  This occurred a few times prior  to arrival.  She reports feeling weak after arrival but has not had any repeated near syncopal events.  Unfortunately patient had an extended wait time prior to my assessment.  She appears well on physical exam.  No significant heart murmur.  Lab workup is reassuring.  Initial and delta troponin negative.  EKG without acute ischemic changes.  Chest x-ray normal.  Very low suspicion for ACS.  She is without risk factors for PE and I have very low suspicion for this.  Suspect vasovagal response.  She was encouraged to follow-up with her primary care provider as soon as possible for reassessment.  She was given return precautions.  Stable at discharge.  At this time there does not appear to be any evidence of an acute emergency medical condition and the patient appears stable for discharge with appropriate outpatient follow up. Diagnosis was discussed with patient who verbalizes understanding of care plan and is agreeable to discharge. I have discussed return precautions with patient and husband who verbalizes understanding. Patient encouraged to follow-up with their PCP within 1 week. All questions answered.  Note: Portions of this report may have been transcribed using voice recognition  software. Every effort was made to ensure accuracy; however, inadvertent computerized transcription errors may still be present.         Final Clinical Impression(s) / ED Diagnoses Final diagnoses:  Near syncope    Rx / DC Orders ED Discharge Orders     None         Edwardo Marsa CHRISTELLA DEVONNA 04/27/23 [id number]    Trine Raynell Moder, MD 04/27/23 [phone]

## 2023-04-28 ENCOUNTER — Encounter: Payer: Self-pay | Admitting: Family Medicine

## 2023-04-28 ENCOUNTER — Other Ambulatory Visit: Payer: Self-pay | Admitting: Family Medicine

## 2023-04-28 DIAGNOSIS — Z13220 Encounter for screening for lipoid disorders: Secondary | ICD-10-CM

## 2023-04-28 DIAGNOSIS — R5383 Other fatigue: Secondary | ICD-10-CM

## 2023-04-29 ENCOUNTER — Other Ambulatory Visit (INDEPENDENT_AMBULATORY_CARE_PROVIDER_SITE_OTHER): Payer: No Typology Code available for payment source

## 2023-04-29 DIAGNOSIS — R5383 Other fatigue: Secondary | ICD-10-CM

## 2023-04-29 DIAGNOSIS — Z13220 Encounter for screening for lipoid disorders: Secondary | ICD-10-CM | POA: Diagnosis not present

## 2023-04-29 LAB — LIPID PANEL
Cholesterol: 247 mg/dL — ABNORMAL HIGH (ref 0–200)
HDL: 46.4 mg/dL (ref >39.00)
LDL Cholesterol: 176 mg/dL — ABNORMAL HIGH (ref 0–99)
NonHDL: 200.12
Total CHOL/HDL Ratio: 5
Triglycerides: 121 mg/dL (ref 0.0–149.0)
VLDL: 24.2 mg/dL (ref 0.0–40.0)

## 2023-04-29 LAB — VITAMIN D 25 HYDROXY (VIT D DEFICIENCY, FRACTURES): VITD: 55.4 ng/mL (ref 30.00–100.00)

## 2023-04-29 LAB — VITAMIN B12: Vitamin B-12: 220 pg/mL (ref 211–911)

## 2023-04-29 NOTE — Progress Notes (Signed)
 No critical labs need to be addressed urgently. We will discuss labs in detail at upcoming office visit.

## 2023-04-29 NOTE — Telephone Encounter (Signed)
 Patient completed lab work today 04/29/2023

## 2023-05-04 ENCOUNTER — Other Ambulatory Visit: Payer: Self-pay | Admitting: Family Medicine

## 2023-05-04 ENCOUNTER — Encounter: Payer: Self-pay | Admitting: Family Medicine

## 2023-05-04 ENCOUNTER — Ambulatory Visit (INDEPENDENT_AMBULATORY_CARE_PROVIDER_SITE_OTHER): Payer: No Typology Code available for payment source | Admitting: Family Medicine

## 2023-05-04 VITALS — BP 128/80 | HR 75 | Temp 99.4°F | Ht 63.78 in | Wt 225.0 lb

## 2023-05-04 DIAGNOSIS — Z0000 Encounter for general adult medical examination without abnormal findings: Secondary | ICD-10-CM

## 2023-05-04 DIAGNOSIS — R55 Syncope and collapse: Secondary | ICD-10-CM | POA: Insufficient documentation

## 2023-05-04 DIAGNOSIS — R5383 Other fatigue: Secondary | ICD-10-CM | POA: Diagnosis not present

## 2023-05-04 DIAGNOSIS — E7800 Pure hypercholesterolemia, unspecified: Secondary | ICD-10-CM | POA: Diagnosis not present

## 2023-05-04 DIAGNOSIS — Z800 Family history of malignant neoplasm of digestive organs: Secondary | ICD-10-CM

## 2023-05-04 NOTE — Assessment & Plan Note (Addendum)
 Chronic, inadequate control with LDL increasing. 10-year ASCVD risk score 3.1%.  But higher risk given first-degree history of father with premature heart disease age 57. Recommend statin medication at least low-dose, patient very hesitant but will discuss this with cardiologist as well.

## 2023-05-04 NOTE — Assessment & Plan Note (Signed)
 Acute, unclear etiology.  Evaluation in ER within normal range.  Concerning for arrhythmia given sudden onset no trigger near syncope. Will refer to cardiology for further evaluation.  Return and ER precautions provided

## 2023-05-04 NOTE — Assessment & Plan Note (Addendum)
 Chronic, not sure if snoring.  Will start with cardiology eval, lab eval unremarkable except low normal B12.  Start B12 supplement. In part possibly secondary to menopausal syndrome. Encouraged healthy eating habits and regular exercise   Will plan sleep apnea testing if not improing and no other findings with cardiology.

## 2023-05-04 NOTE — Progress Notes (Signed)
 Patient ID: Donal GORMAN Pa, female    DOB: 19-Mar-1966, 58 y.o.   MRN: [id number]  This visit was conducted in person.  BP 128/80   Pulse 75   Temp 99.4 F (37.4 C) (Oral)   Ht 5' 3.78 (1.62 m)   Wt 225 lb (102.1 kg)   LMP 02/10/2021   SpO2 97%   BMI 38.89 kg/m    CC:  Chief Complaint  Patient presents with   Annual Exam    New Years Eve patient went to the ED had a syncope episode    Subjective:   HPI: [place] ARNITRA SOKOLOSKI is a 58 y.o. female presenting on 05/04/2023 for Annual Exam (New Years Eve patient went to the ED had a syncope episode)  The patient presents for annual medicare wellness, complete physical and review of chronic health problems. He/She also has the following acute concerns today: pre-syncopal episode  Pre-syncope:  Reviewed ED visit from April 26, 2023  Reported she was sitting at a restaurant when she developed sudden onset lightheadedness and diaphoresis. No LOC  No palpitations, no CP, no SOB. Had already eaten ( salad and small 1/2 steak, water, no ETOH) By the time she was seen in the ER (after 10 hours) her symptoms had improved and she only described mild weakness and intermittent typical mild right sided chest pains. Chest x-ray  showed no active cardiopulmonary disease Complete metabolic panel was normal except for low potassium, glucose 138 CBC unremarkable, negative troponin x 2 EKG showed normal sinus rhythm, no ST T changes, no LVH, no Q waves  Nml ECHO in 2021  She reports  she continues to feel excessively tired .  Not sure if she snores.  Still headaches, some  intermittent pain in right chest   BP Readings from Last 3 Encounters:  05/04/23 128/80  04/27/23 (!) 156/86  04/06/23 120/72   Vitamin D  and vitamin B12 are in the normal range with supplementation.  Elevated Cholesterol: Cholesterol is above goal with LDL at 176, HDL low normal at 46  Stonrg family history.. father with MI age 70. Treadmill  2018 nml. Lab  Results  Component Value Date   CHOL 247 (H) 04/29/2023   HDL 46.40 04/29/2023   LDLCALC 176 (H) 04/29/2023   LDLDIRECT 130.4 03/15/2008   TRIG 121.0 04/29/2023   CHOLHDL 5 04/29/2023   The 10-year ASCVD risk score (Arnett DK, et al., 2019) is: 3.1%   Values used to calculate the score:     Age: 76 years     Sex: Female     Is Non-Hispanic African American: No     Diabetic: No     Tobacco smoker: No     Systolic Blood Pressure: 128 mmHg     Is BP treated: No     HDL Cholesterol: 46.4 mg/dL     Total Cholesterol: 247 mg/dL Using medications without problems: Muscle aches:  Diet compliance: Exercise: Other complaints:   Wt Readings from Last 3 Encounters:  05/04/23 225 lb (102.1 kg)  04/26/23 223 lb (101.2 kg)  04/06/23 227 lb 2 oz (103 kg)  Body mass index is 38.89 kg/m.   Exercise:  occassional  Diet: moderate    She states she is going through menopause. 02/2021 S/P fibroidectomy and polypectomy.. non cancerous pathology.  GYN Dr. Cyndee.  No menses  now.   GERD: Stable control on omeprazole  20 mg daily  Mild intermittent asthma: No recent flares.  Not requiring albuterol  inhaler or  controller.  She treats allergic rhinitis with Flonase  2 sprays per nostril daily and Claritin 10 mg daily.  Migraine with aura:  rare occurrence.  Relevant past medical, surgical, family and social history reviewed and updated as indicated. Interim medical history since our last visit reviewed. Allergies and medications reviewed and updated. Outpatient Medications Prior to Visit  Medication Sig Dispense Refill   acetaminophen  (TYLENOL ) 500 MG tablet Take 2 tablets (1,000 mg total) by mouth every 6 (six) hours as needed. 30 tablet 0   COLLAGEN PO Take 1 Scoop by mouth daily.     Collagen-Boron-Hyaluronic Acid ([employer] JOINT HEALTH TRIPLE ACTION PO) Take 1 tablet by mouth in the morning.     fluconazole  (DIFLUCAN ) 150 MG tablet Take 150 mg by mouth once.     fluticasone  (FLONASE ) 50  MCG/ACT nasal spray SPRAY 2 SPRAYS INTO EACH NOSTRIL EVERY DAY 16 mL 1   GLUCOSAMINE-CHONDROITIN PO Take 2 tablets by mouth daily.     loratadine (CLARITIN) 10 MG tablet Take 10 mg by mouth in the morning.     MAGNESIUM  PO Take 420 tablets by mouth 2 (two) times daily.     meloxicam  (MOBIC ) 15 MG tablet Take 1 tablet (15 mg total) by mouth daily. 30 tablet 0   Omega-3 Fatty Acids (DIALYVITE OMEGA-3 CONCENTRATE) 600 MG CAPS Take 600 mg by mouth daily.     omeprazole  (PRILOSEC ) 20 MG capsule Take 1 capsule (20 mg total) by mouth daily as needed (acid reflux/indigestion.). 90 capsule 3   TURMERIC PO Take 1 tablet by mouth daily.     Vitamin D -Vitamin K (VITAMIN K2-VITAMIN D3 PO) Take 125 mcg by mouth daily.     cholecalciferol (VITAMIN D ) 1000 units tablet Take 1,000 Units by mouth in the morning.     doxycycline  (VIBRA -TABS) 100 MG tablet Take 1 tablet (100 mg total) by mouth 2 (two) times daily. 20 tablet 0   Omega-3 Fatty Acids (FISH OIL) 1200 MG CAPS Take 600 mg by mouth daily.     No facility-administered medications prior to visit.     Per HPI unless specifically indicated in ROS section below Review of Systems  Constitutional:  Negative for fatigue and fever.  HENT:  Negative for congestion.   Eyes:  Negative for pain.  Respiratory:  Negative for cough and shortness of breath.   Cardiovascular:  Negative for chest pain, palpitations and leg swelling.  Gastrointestinal:  Negative for abdominal pain.  Genitourinary:  Negative for dysuria and vaginal bleeding.  Musculoskeletal:  Negative for back pain.  Neurological:  Negative for syncope, light-headedness and headaches.  Psychiatric/Behavioral:  Negative for dysphoric mood.    Objective:  BP 128/80   Pulse 75   Temp 99.4 F (37.4 C) (Oral)   Ht 5' 3.78 (1.62 m)   Wt 225 lb (102.1 kg)   LMP 02/10/2021   SpO2 97%   BMI 38.89 kg/m   Wt Readings from Last 3 Encounters:  05/04/23 225 lb (102.1 kg)  04/26/23 223 lb (101.2 kg)   04/06/23 227 lb 2 oz (103 kg)      Physical Exam Constitutional:      General: She is not in acute distress.    Appearance: Normal appearance. She is well-developed. She is not ill-appearing or toxic-appearing.  HENT:     Head: Normocephalic.     Right Ear: Hearing, tympanic membrane, ear canal and external ear normal. Tympanic membrane is not erythematous, retracted or bulging.     Left Ear: Hearing,  tympanic membrane, ear canal and external ear normal. Tympanic membrane is not erythematous, retracted or bulging.     Nose: No mucosal edema or rhinorrhea.     Right Sinus: No maxillary sinus tenderness or frontal sinus tenderness.     Left Sinus: No maxillary sinus tenderness or frontal sinus tenderness.     Mouth/Throat:     Pharynx: Uvula midline.  Eyes:     General: Lids are normal. Lids are everted, no foreign bodies appreciated.     Conjunctiva/sclera: Conjunctivae normal.     Pupils: Pupils are equal, round, and reactive to light.  Neck:     Thyroid : No thyroid  mass or thyromegaly.     Vascular: No carotid bruit.     Trachea: Trachea normal.  Cardiovascular:     Rate and Rhythm: Normal rate and regular rhythm.     Pulses: Normal pulses.     Heart sounds: Normal heart sounds, S1 normal and S2 normal. No murmur heard.    No friction rub. No gallop.  Pulmonary:     Effort: Pulmonary effort is normal. No tachypnea or respiratory distress.     Breath sounds: Normal breath sounds. No decreased breath sounds, wheezing, rhonchi or rales.  Abdominal:     General: Bowel sounds are normal.     Palpations: Abdomen is soft.     Tenderness: There is no abdominal tenderness.  Musculoskeletal:     Cervical back: Normal range of motion and neck supple.  Skin:    General: Skin is warm and dry.     Findings: No rash.  Neurological:     Mental Status: She is alert.  Psychiatric:        Mood and Affect: Mood is not anxious or depressed.        Speech: Speech normal.        Behavior:  Behavior normal. Behavior is cooperative.        Thought Content: Thought content normal.        Judgment: Judgment normal.       Results for orders placed or performed in visit on 04/29/23  Vitamin B12   Collection Time: 04/29/23  8:44 AM  Result Value Ref Range   Vitamin B-12 220 211 - 911 pg/mL  VITAMIN D  25 Hydroxy (Vit-D Deficiency, Fractures)   Collection Time: 04/29/23  8:44 AM  Result Value Ref Range   VITD 55.40 30.00 - 100.00 ng/mL  Lipid panel   Collection Time: 04/29/23  8:44 AM  Result Value Ref Range   Cholesterol 247 (H) 0 - 200 mg/dL   Triglycerides 878.9 0.0 - 149.0 mg/dL   HDL 53.59 >60.99 mg/dL   VLDL 75.7 0.0 - 59.9 mg/dL   LDL Cholesterol 823 (H) 0 - 99 mg/dL   Total CHOL/HDL Ratio 5    NonHDL 200.12     This visit occurred during the SARS-CoV-2 public health emergency.  Safety protocols were in place, including screening questions prior to the visit, additional usage of staff PPE, and extensive cleaning of exam room while observing appropriate contact time as indicated for disinfecting solutions.   COVID 19 screen:  No recent travel or known exposure to COVID19 The patient denies respiratory symptoms of COVID 19 at this time. The importance of social distancing was discussed today.   Assessment and Plan   The patient's preventative maintenance and recommended screening tests for an annual wellness exam were reviewed in full today. Brought up to date unless services declined.  Counselled on the  importance of diet, exercise, and its role in overall health and mortality. The patient's FH and SH was reviewed, including their home life, tobacco status, and drug and alcohol status.   Vaccines: Up-to-date with flu and tetanus, consider shingles vaccine. Pap/DVE: [facility]/19/[facility] Mammo: 05/2022 normal.. recommended further eval given asymmetry on left... re-eval showed cyst... repeat screening due 2/2.025 Colon: father with  colon cancer .SABRA overdue for screening.. refer for colonoscopy  Smoking Status: none ETOH/ drug use:  none/none  Hep C: Due  HIV screen: Refused  Problem List Items Addressed This Visit     High cholesterol   Chronic, inadequate control with LDL increasing. 10-year ASCVD risk score 3.1%.  But higher risk given first-degree history of father with premature heart disease age 25. Recommend statin medication at least low-dose, patient very hesitant but will discuss this with cardiologist as well.       Other fatigue    Chronic, not sure if snoring.  Will start with cardiology eval, lab eval unremarkable except low normal B12.  Start B12 supplement. In part possibly secondary to menopausal syndrome. Encouraged healthy eating habits and regular exercise   Will plan sleep apnea testing if not improing and no other findings with cardiology.      Pre-syncope   Acute, unclear etiology.  Evaluation in ER within normal range.  Concerning for arrhythmia given sudden onset no trigger near syncope. Will refer to cardiology for further evaluation.  Return and ER precautions provided      Relevant Orders   Ambulatory referral to Cardiology   Other Visit Diagnoses       Routine general medical examination at a health care facility    -  Primary     Family history of colon cancer       Relevant Orders   Amb Referral to Colonoscopy      Return in about 1 year (around 05/03/2024) for annual physical with fasting labs prior.   Greig Ring, MD

## 2023-05-04 NOTE — Patient Instructions (Addendum)
 Add B12 1000 mg daily.  Consider cholesterol medication initiation.. work on [employer].

## 2023-05-04 NOTE — Telephone Encounter (Signed)
 Last office visit today 05/04/2023 for CPE.  Last refilled 02/01/2023 for #30 with no refills.  Next Appt: CPE 05/04/2024.

## 2023-05-19 ENCOUNTER — Other Ambulatory Visit: Payer: Self-pay | Admitting: Family Medicine

## 2023-05-19 DIAGNOSIS — Z1231 Encounter for screening mammogram for malignant neoplasm of breast: Secondary | ICD-10-CM

## 2023-06-01 ENCOUNTER — Other Ambulatory Visit: Payer: Self-pay | Admitting: Family Medicine

## 2023-06-01 NOTE — Telephone Encounter (Signed)
 Last office visit 05/04/2023 for CPE.  Last refilled 05/05/23 for #30 with no refills.  Next Appt: CPE 05/04/2024.

## 2023-06-08 ENCOUNTER — Ambulatory Visit: Payer: No Typology Code available for payment source

## 2023-06-10 ENCOUNTER — Ambulatory Visit
Admission: RE | Admit: 2023-06-10 | Discharge: 2023-06-10 | Disposition: A | Discharge status: Home / Self Care | Payer: No Typology Code available for payment source | Source: Ambulatory Visit | Attending: Family Medicine | Admitting: Family Medicine

## 2023-06-10 DIAGNOSIS — Z1231 Encounter for screening mammogram for malignant neoplasm of breast: Secondary | ICD-10-CM

## 2023-06-29 ENCOUNTER — Other Ambulatory Visit: Payer: Self-pay | Admitting: Family Medicine

## 2023-06-29 NOTE — Telephone Encounter (Signed)
 Last office visit 05/04/23 for CPE.  Last refilled 06/01/2023 for #30 with no refills.  Next Appt: CPE 05/04/2024.

## 2023-07-11 ENCOUNTER — Telehealth: Payer: Self-pay | Admitting: *Deleted

## 2023-07-11 NOTE — Telephone Encounter (Signed)
 Copied from CRM [id number]. Topic: General - Other >> Jul 11, 2023 11:56 AM Sharon Chaney wrote: Reason for CRM: patient called stating she need a copy of her knee xrays

## 2023-07-11 NOTE — Telephone Encounter (Signed)
 Pt's spouse picked up disc on 07/11/23.

## 2023-07-20 ENCOUNTER — Encounter: Payer: Self-pay | Admitting: *Deleted

## 2023-07-20 ENCOUNTER — Ambulatory Visit
Payer: No Typology Code available for payment source | Attending: Cardiovascular Disease | Admitting: Cardiovascular Disease

## 2023-07-20 ENCOUNTER — Encounter: Payer: Self-pay | Admitting: Family Medicine

## 2023-07-20 ENCOUNTER — Encounter: Payer: Self-pay | Admitting: Cardiovascular Disease

## 2023-07-20 VITALS — BP 120/82 | HR 71 | Ht 64.0 in | Wt 219.8 lb

## 2023-07-20 DIAGNOSIS — R0789 Other chest pain: Secondary | ICD-10-CM | POA: Diagnosis not present

## 2023-07-20 DIAGNOSIS — R42 Dizziness and giddiness: Secondary | ICD-10-CM | POA: Insufficient documentation

## 2023-07-20 DIAGNOSIS — E785 Hyperlipidemia, unspecified: Secondary | ICD-10-CM

## 2023-07-20 DIAGNOSIS — Z8249 Family history of ischemic heart disease and other diseases of the circulatory system: Secondary | ICD-10-CM | POA: Diagnosis not present

## 2023-07-20 DIAGNOSIS — R55 Syncope and collapse: Secondary | ICD-10-CM | POA: Diagnosis not present

## 2023-07-20 NOTE — Assessment & Plan Note (Signed)
 Patient had an episode of presyncope on New Year's Day of this year while having lunch with family.  She felt diaphoretic and dizzy but did not pass out.  It sounds like a vagal episode.  She has had no prior or subsequent episodes.  She has had a normal 2D echo in the past.  I am going to get a 30-day event monitor.

## 2023-07-20 NOTE — Patient Instructions (Signed)
 Medication Instructions:  Your physician recommends that you continue on your current medications as directed. Please refer to the Current Medication list given to you today.  *If you need a refill on your cardiac medications before your next appointment, please call your pharmacy*   Testing/Procedures: Dr. Allyson Sabal has ordered a CT coronary calcium score.   Test locations:  [facility]  [place] [facility] at [facility]  This is $99 out of pocket.   Coronary CalciumScan A coronary calcium scan is an imaging test used to look for deposits of calcium and other fatty materials (plaques) in the inner lining of the blood vessels of the heart (coronary arteries). These deposits of calcium and plaques can partly clog and narrow the coronary arteries without producing any symptoms or warning signs. This puts a person at risk for a heart attack. This test can detect these deposits before symptoms develop. Tell a health care provider about: Any allergies you have. All medicines you are taking, including vitamins, herbs, eye drops, creams, and over-the-counter medicines. Any problems you or family members have had with anesthetic medicines. Any blood disorders you have. Any surgeries you have had. Any medical conditions you have. Whether you are pregnant or may be pregnant. What are the risks? Generally, this is a safe procedure. However, problems may occur, including: Harm to a pregnant woman and her unborn baby. This test involves the use of radiation. Radiation exposure can be dangerous to a pregnant woman and her unborn baby. If you are pregnant, you generally should not have this procedure done. Slight increase in the risk of cancer. This is because of the radiation involved in the test. What happens before the procedure? No preparation is needed for this procedure. What happens during the procedure? You will undress and  remove any jewelry around your neck or chest. You will put on a hospital gown. Sticky electrodes will be placed on your chest. The electrodes will be connected to an electrocardiogram (ECG) machine to record a tracing of the electrical activity of your heart. A CT scanner will take pictures of your heart. During this time, you will be asked to lie still and hold your breath for 2-3 seconds while a picture of your heart is being taken. The procedure may vary among health care providers and hospitals. What happens after the procedure? You can get dressed. You can return to your normal activities. It is up to you to get the results of your test. Ask your health care provider, or the department that is doing the test, when your results will be ready. Summary A coronary calcium scan is an imaging test used to look for deposits of calcium and other fatty materials (plaques) in the inner lining of the blood vessels of the heart (coronary arteries). Generally, this is a safe procedure. Tell your health care provider if you are pregnant or may be pregnant. No preparation is needed for this procedure. A CT scanner will take pictures of your heart. You can return to your normal activities after the scan is done. This information is not intended to replace advice given to you by your health care provider. Make sure you discuss any questions you have with your health care provider. Document Released: 10/09/2007 Document Revised: 03/01/2016 Document Reviewed: 03/01/2016 Elsevier Interactive Patient Education  2017 Elsevier Inc.   [employer] Cardiac Event Monitor Instructions  Your physician has requested you wear your cardiac event monitor for 30 days. [employer]  may call or text to confirm a shipping address. The monitor will be sent to a land address via UPS. [employer] will not ship a monitor to a PO BOX. It typically takes 3-5 days to receive your monitor after it has been enrolled. [employer] will  assist with USPS tracking if your package is delayed. The telephone number for [employer] is [phone]. Once you have received your monitor, please review the enclosed instructions. Instruction tutorials can also be viewed under help and settings on the enclosed cell phone. Your monitor has already been registered assigning a specific monitor serial # to you.  Billing and Self Pay Discount Information  [employer] has been provided the insurance information we had on file for you.  If your insurance has been updated, please call [employer] at [phone] to provide them with your updated insurance information.   [employer] offers a discounted Self Pay option for patients who have insurance that does not cover their cardiac event monitor or patients without insurance.  The discounted cost of a Self Pay Cardiac Event Monitor would be $225.00 , if the patient contacts [employer] at [phone] within 7 days of applying the monitor to make payment arrangements.  If the patient does not contact [employer] within 7 days of applying the monitor, the cost of the cardiac event monitor will be $350.00.  Applying the monitor  Remove cell phone from case and turn it on. The cell phone works as IT consultant and needs to be within [employer] of you at all times. The cell phone will need to be charged on a daily basis. We recommend you plug the cell phone into the enclosed charger at your bedside table every night.  Monitor batteries: You will receive two monitor batteries labelled #1 and #2. These are your recorders. Plug battery #2 onto the second connection on the enclosed charger. Keep one battery on the charger at all times. This will keep the monitor battery deactivated. It will also keep it fully charged for when you need to switch your monitor batteries. A small light will be blinking on the battery emblem when it is charging. The light on the battery emblem will remain on when the battery is  fully charged.  Open package of a Monitor strip. Insert battery #1 into black hood on strip and gently squeeze monitor battery onto connection as indicated in instruction booklet. Set aside while preparing skin.  Choose location for your strip, vertical or horizontal, as indicated in the instruction booklet. Shave to remove all hair from location. There cannot be any lotions, oils, powders, or colognes on skin where monitor is to be applied. Wipe skin clean with enclosed Saline wipe. Dry skin completely.  Peel paper labeled #1 off the back of the Monitor strip exposing the adhesive. Place the monitor on the chest in the vertical or horizontal position shown in the instruction booklet. One arrow on the monitor strip must be pointing upward. Carefully remove paper labeled #2, attaching remainder of strip to your skin. Try not to create any folds or wrinkles in the strip as you apply it.  Firmly press and release the circle in the center of the monitor battery. You will hear a small beep. This is turning the monitor battery on. The heart emblem on the monitor battery will light up every 5 seconds if the monitor battery in turned on and connected to the patient securely. Do not push and hold the circle down as this turns the monitor battery off. The cell  phone will locate the monitor battery. A screen will appear on the cell phone checking the connection of your monitor strip. This may read poor connection initially but change to good connection within the next minute. Once your monitor accepts the connection you will hear a series of 3 beeps followed by a climbing crescendo of beeps. A screen will appear on the cell phone showing the two monitor strip placement options. Touch the picture that demonstrates where you applied the monitor strip.  Your monitor strip and battery are waterproof. You are able to shower, bathe, or swim with the monitor on. They just ask you do not submerge deeper than 3  feet underwater. We recommend removing the monitor if you are swimming in a lake, river, or ocean.  Your monitor battery will need to be switched to a fully charged monitor battery approximately once a week. The cell phone will alert you of an action which needs to be made.  On the cell phone, tap for details to reveal connection status, monitor battery status, and cell phone battery status. The green dots indicates your monitor is in good status. A red dot indicates there is something that needs your attention.  To record a symptom, click the circle on the monitor battery. In 30-60 seconds a list of symptoms will appear on the cell phone. Select your symptom and tap save. Your monitor will record a sustained or significant arrhythmia regardless of you clicking the button. Some patients do not feel the heart rhythm irregularities. [employer] will notify us of any serious or critical events.  Refer to instruction booklet for instructions on switching batteries, changing strips, the Do not disturb or Pause features, or any additional questions.  Call [employer] at [phone], to confirm your monitor is transmitting and record your baseline. They will answer any questions you may have regarding the monitor instructions at that time.  Returning the monitor to [employer]  Place all equipment back into blue box. Peel off strip of paper to expose adhesive and close box securely. There is a prepaid UPS shipping label on this box. Drop in a UPS drop box, or at a UPS facility like [employer]. You may also contact [employer] to arrange UPS to pick up monitor package at your home.    Follow-Up: At [facility], you and your health needs are our priority.  As part of our continuing mission to provide you with exceptional heart care, we have created designated Provider Care Teams.  These Care Teams include your primary Cardiologist (physician) and Advanced Practice Providers (APPs -   Physician Assistants and Nurse Practitioners) who all work together to provide you with the care you need, when you need it.  We recommend signing up for the patient portal called "MyChart".  Sign up information is provided on this After Visit Summary.  MyChart is used to connect with patients for Virtual Visits (Telemedicine).  Patients are able to view lab/test results, encounter notes, upcoming appointments, etc.  Non-urgent messages can be sent to your provider as well.   To learn more about what you can do with MyChart, go to [email or web address].    Your next appointment:   6 month(s)  Provider:   Nanetta Batty, MD    Other Instructions   1st Floor: - Lobby - Registration  - Pharmacy  - Lab - Cafe  2nd Floor: - PV Lab - Diagnostic Testing (echo, CT, nuclear med)  3rd Floor: - Vacant  4th Floor: - TCTS (cardiothoracic surgery) -  AFib Clinic - Structural Heart Clinic - Vascular Surgery  - Vascular Ultrasound  5th Floor: - [facility] Cardiology (general and EP) - Clinical Pharmacy for coumadin, hypertension, lipid, weight-loss medications, and med management appointments    Valet parking services will be available as well.

## 2023-07-20 NOTE — Assessment & Plan Note (Signed)
 Father had bypass surgery at age 57.

## 2023-07-20 NOTE — Progress Notes (Signed)
 Patient enrolled for [employer]/ [employer] to ship a 30 day cardiac event monitor with sensitive skin electrodes to her address on file.

## 2023-07-20 NOTE — Assessment & Plan Note (Signed)
 History of hyperlipidemia not on statin therapy with lipid profile performed 04/29/2023 revealing total cholesterol 247, LDL of 176 and HDL 46.  I am going to get a coronary calcium score to her stratify given her family history.  She will undoubtedly need to be on a statin drug.

## 2023-07-20 NOTE — Progress Notes (Signed)
 07/20/2023 Sharon Chaney   04-18-1966  [id number]  Primary Physician Excell Seltzer, MD Primary Cardiologist: Runell Gess MD Nicholes Calamity, [place]  HPI:  Sharon Chaney is a 58 y.o. moderately overweight married Caucasian female mother of 2 children, grandmother of 1 grandchild who is accompanied by her husband Sharon Chaney who is a patient of mine as well.  She stays at home with her children.  Her risk factors include untreated hyperlipidemia and family history of with father had bypass surgery at age 41.  She is never had a heart attack or stroke.  She does complain of occasional atypical chest pain which does not sound ischemic.  She has had a normal 2D echo 01/03/2020.  On New Year's Day of this year she was at lunch with her family and noted to be presyncopal and diaphoretic.  She went to the ER where her workup was unrevealing.  She has had no subsequent episodes.  Her symptoms sound somewhat vagal.   Current Meds  Medication Sig   acetaminophen (TYLENOL) 500 MG tablet Take 2 tablets (1,000 mg total) by mouth every 6 (six) hours as needed.   COLLAGEN PO Take 1 Scoop by mouth daily.   Collagen-Boron-Hyaluronic Acid ([employer] JOINT HEALTH TRIPLE ACTION PO) Take 1 tablet by mouth in the morning.   fluticasone (FLONASE) 50 MCG/ACT nasal spray SPRAY 2 SPRAYS INTO EACH NOSTRIL EVERY DAY   GLUCOSAMINE-CHONDROITIN PO Take 2 tablets by mouth daily.   loratadine (CLARITIN) 10 MG tablet Take 10 mg by mouth in the morning.   MAGNESIUM PO Take 420 tablets by mouth 2 (two) times daily.   meloxicam (MOBIC) 15 MG tablet TAKE 1 TABLET (15 MG TOTAL) BY MOUTH DAILY.   Omega-3 Fatty Acids (DIALYVITE OMEGA-3 CONCENTRATE) 600 MG CAPS Take 600 mg by mouth daily.   TURMERIC PO Take 1 tablet by mouth daily.   Vitamin D-Vitamin K (VITAMIN K2-VITAMIN D3 PO) Take 125 mcg by mouth daily.   [DISCONTINUED] fluconazole (DIFLUCAN) 150 MG tablet Take 150 mg by mouth once.   [DISCONTINUED] omeprazole (PRILOSEC)  20 MG capsule Take 1 capsule (20 mg total) by mouth daily as needed (acid reflux/indigestion.).     Allergies  Allergen Reactions   Lactose Intolerance (Gi)    Promethazine Other (See Comments)    (Intolerant)--unknown reaction   Latex Rash   Tomato Rash    Social History   Socioeconomic History   Marital status: Married    Spouse name: Not on file   Number of children: Not on file   Years of education: Not on file   Highest education level: Not on file  Occupational History   Not on file  Tobacco Use   Smoking status: Never   Smokeless tobacco: Never  Vaping Use   Vaping status: Never Used  Substance and Sexual Activity   Alcohol use: No   Drug use: No   Sexual activity: Yes    Birth control/protection: None    Comment: Married  Other Topics Concern   Not on file  Social History Narrative   Not on file   Social Drivers of Health   Financial Resource Strain: Not on file  Food Insecurity: Not on file  Transportation Needs: Not on file  Physical Activity: Not on file  Stress: Not on file  Social Connections: Unknown (04/05/2023)   Social Connection and Isolation Panel [NHANES]    Frequency of Communication with Friends and Family: Not on file  Frequency of Social Gatherings with Friends and Family: Not on file    Attends Religious Services: Not on file    Active Member of Clubs or Organizations: No    Attends Banker Meetings: Not on file    Marital Status: Married  Catering manager Violence: Not on file     Review of Systems: General: negative for chills, fever, night sweats or weight changes.  Cardiovascular: negative for chest pain, dyspnea on exertion, edema, orthopnea, palpitations, paroxysmal nocturnal dyspnea or shortness of breath Dermatological: negative for rash Respiratory: negative for cough or wheezing Urologic: negative for hematuria Abdominal: negative for nausea, vomiting, diarrhea, bright red blood per rectum, melena, or  hematemesis Neurologic: negative for visual changes, syncope, or dizziness All other systems reviewed and are otherwise negative except as noted above.    Blood pressure 120/82, pulse 71, height 5\' 4"  (1.626 m), weight 219 lb 12.8 oz (99.7 kg), last menstrual period 02/10/2021, SpO2 99%.  General appearance: alert and no distress Neck: no adenopathy, no carotid bruit, no JVD, supple, symmetrical, trachea midline, and thyroid not enlarged, symmetric, no tenderness/mass/nodules Lungs: clear to auscultation bilaterally Heart: regular rate and rhythm, S1, S2 normal, no murmur, click, rub or gallop Extremities: extremities normal, atraumatic, no cyanosis or edema Pulses: 2+ and symmetric Skin: Skin color, texture, turgor normal. No rashes or lesions Neurologic: Grossly normal  EKG EKG Interpretation Date/Time:  Wednesday July 20 2023 09:22:13 EDT Ventricular Rate:  71 PR Interval:  128 QRS Duration:  80 QT Interval:  390 QTC Calculation: 423 R Axis:   57  Text Interpretation: Normal sinus rhythm Normal ECG When compared with ECG of 26-Apr-2023 22:47, PREVIOUS ECG IS PRESENT Confirmed by Nanetta Batty [phone]) on 07/20/2023 9:27:57 AM    ASSESSMENT AND PLAN:   Family history of heart disease Father had bypass surgery at age 25.  Pre-syncope Patient had an episode of presyncope on New Year's Day of this year while having lunch with family.  She felt diaphoretic and dizzy but did not pass out.  It sounds like a vagal episode.  She has had no prior or subsequent episodes.  She has had a normal 2D echo in the past.  I am going to get a 30-day event monitor.  High cholesterol History of hyperlipidemia not on statin therapy with lipid profile performed 04/29/2023 revealing total cholesterol 247, LDL of 176 and HDL 46.  I am going to get a coronary calcium score to her stratify given her family history.  She will undoubtedly need to be on a statin drug.     Runell Gess MD  FACP,FACC,FAHA, [facility] 07/20/2023 9:41 AM

## 2023-07-30 ENCOUNTER — Other Ambulatory Visit: Payer: Self-pay | Admitting: Family Medicine

## 2023-08-01 NOTE — Telephone Encounter (Signed)
 Last office visit 05/04/23 for CPE.  Last refilled 06/29/2023 for #30 with no refills.  Next Appt: CPE 05/04/2024.

## 2023-08-24 ENCOUNTER — Ambulatory Visit (HOSPITAL_BASED_OUTPATIENT_CLINIC_OR_DEPARTMENT_OTHER)
Admission: RE | Admit: 2023-08-24 | Discharge: 2023-08-24 | Disposition: A | Discharge status: Home / Self Care | Payer: Self-pay | Source: Ambulatory Visit | Attending: Cardiovascular Disease | Admitting: Cardiovascular Disease

## 2023-08-24 DIAGNOSIS — R55 Syncope and collapse: Secondary | ICD-10-CM | POA: Insufficient documentation

## 2023-08-24 DIAGNOSIS — E785 Hyperlipidemia, unspecified: Secondary | ICD-10-CM | POA: Insufficient documentation

## 2023-08-24 DIAGNOSIS — R0789 Other chest pain: Secondary | ICD-10-CM | POA: Insufficient documentation

## 2023-08-24 DIAGNOSIS — Z8249 Family history of ischemic heart disease and other diseases of the circulatory system: Secondary | ICD-10-CM | POA: Insufficient documentation

## 2023-08-26 ENCOUNTER — Ambulatory Visit: Attending: Cardiovascular Disease

## 2023-08-26 DIAGNOSIS — R42 Dizziness and giddiness: Secondary | ICD-10-CM

## 2023-08-26 DIAGNOSIS — R55 Syncope and collapse: Secondary | ICD-10-CM

## 2023-08-29 DIAGNOSIS — R55 Syncope and collapse: Secondary | ICD-10-CM

## 2023-08-29 DIAGNOSIS — R42 Dizziness and giddiness: Secondary | ICD-10-CM | POA: Diagnosis not present

## 2023-09-02 ENCOUNTER — Encounter: Payer: Self-pay | Admitting: Cardiovascular Disease

## 2023-09-14 ENCOUNTER — Ambulatory Visit: Admitting: Family Medicine

## 2023-09-14 ENCOUNTER — Encounter: Payer: Self-pay | Admitting: Family Medicine

## 2023-09-14 VITALS — BP 128/84 | HR 88 | Temp 99.0°F | Ht 64.0 in | Wt 212.0 lb

## 2023-09-14 DIAGNOSIS — J301 Allergic rhinitis due to pollen: Secondary | ICD-10-CM | POA: Diagnosis not present

## 2023-09-14 DIAGNOSIS — J0100 Acute maxillary sinusitis, unspecified: Secondary | ICD-10-CM | POA: Diagnosis not present

## 2023-09-14 MED ORDER — AMOXICILLIN 500 MG PO CAPS: 1000.0000 mg | ORAL_CAPSULE | Freq: Two times a day (BID) | ORAL | 0 refills | Status: DC

## 2023-09-14 MED ORDER — ALBUTEROL SULFATE HFA 108 (90 BASE) MCG/ACT IN AERS: 2.0000 | INHALATION_SPRAY | Freq: Four times a day (QID) | RESPIRATORY_TRACT | 2 refills | Status: AC | PRN

## 2023-09-14 NOTE — Assessment & Plan Note (Signed)
 Follow-up acute, most likely initial allergies, now with concern for bacterial superinfection.  She also has an element of what sounds like reactive airway disease causing coughing fits and tickle in her throat. She has intolerable side effects to prednisone .  Will treat with amoxicillin  500 mg 2 capsules twice daily x 10 days to cover for bacterial superinfection. Encouraged her to continue her allergy regimen but add nasal saline irrigation.  She can use albuterol inhaler 2 puffs every 4-6 hours as needed for coughing fits.  Return and ER precautions provided

## 2023-09-14 NOTE — Progress Notes (Signed)
 Patient ID: Sharon Chaney, female    DOB: 1965-06-08, 58 y.o.   MRN: [id number]  This visit was conducted in person.  BP 128/84   Pulse 88   Temp 99 F (37.2 C) (Oral)   Ht 5\' 4"  (1.626 m)   Wt 212 lb (96.2 kg)   LMP 02/10/2021   SpO2 97%   BMI 36.39 kg/m    CC:  Chief Complaint  Patient presents with   Sore Throat    Patient states that about 2 weeks ago and it cleared and then on Monday of this week the symptoms reoccurred    Nasal Congestion   Otalgia    Subjective:   HPI: Sharon Chaney is a 58 y.o. female presenting on 09/14/2023 for Sore Throat (Patient states that about 2 weeks ago and it cleared and then on Monday of this week the symptoms reoccurred ), Nasal Congestion, and Otalgia   Date of onset:  2 weeks Initial symptoms included  Nasal congestion, productive cough, sinus pressure, ear ache, ST Symptoms  improved temporarily but returned worse.. right ear pain.   Right face pain. She has been outside a lot... lots of pollen.  Some intermittent SOB.Aaron Aas tickle in throat and coughing fits.  HAs had asthma like reactions with allergies and illness in past... no dx of asthma   Sick contacts:  ear infection in grandchild, wedding last weekend COVID testing:   none     She has tried to treat with  tylenol , mucinex , loratidine, flonase      No history of chronic lung disease such as asthma or COPD.    Non-smoker.      Relevant past medical, surgical, family and social history reviewed and updated as indicated. Interim medical history since our last visit reviewed. Allergies and medications reviewed and updated. Outpatient Medications Prior to Visit  Medication Sig Dispense Refill   acetaminophen  (TYLENOL ) 500 MG tablet Take 2 tablets (1,000 mg total) by mouth every 6 (six) hours as needed. 30 tablet 0   COLLAGEN PO Take 1 Scoop by mouth daily.     Collagen-Boron-Hyaluronic Acid ([employer] JOINT HEALTH TRIPLE ACTION PO) Take 1 tablet by mouth in the morning.      fluticasone  (FLONASE ) 50 MCG/ACT nasal spray SPRAY 2 SPRAYS INTO EACH NOSTRIL EVERY DAY 16 mL 1   GLUCOSAMINE-CHONDROITIN PO Take 2 tablets by mouth daily.     loratadine (CLARITIN) 10 MG tablet Take 10 mg by mouth in the morning.     MAGNESIUM  PO Take 420 tablets by mouth 2 (two) times daily.     meloxicam  (MOBIC ) 15 MG tablet TAKE 1 TABLET (15 MG TOTAL) BY MOUTH DAILY. 30 tablet 0   Omega-3 Fatty Acids (DIALYVITE OMEGA-3 CONCENTRATE) 600 MG CAPS Take 600 mg by mouth daily.     TURMERIC PO Take 1 tablet by mouth daily.     Vitamin D -Vitamin K (VITAMIN K2-VITAMIN D3 PO) Take 125 mcg by mouth daily.     No facility-administered medications prior to visit.     Per HPI unless specifically indicated in ROS section below Review of Systems  Constitutional:  Negative for fatigue and fever.  HENT:  Positive for congestion, ear pain, postnasal drip, sinus pressure, sinus pain and sore throat.   Eyes:  Negative for pain.  Respiratory:  Positive for cough and shortness of breath. Negative for wheezing.   Cardiovascular:  Negative for chest pain, palpitations and leg swelling.  Gastrointestinal:  Negative for abdominal pain.  Genitourinary:  Negative for dysuria and vaginal bleeding.  Musculoskeletal:  Negative for back pain.  Neurological:  Negative for syncope, light-headedness and headaches.  Psychiatric/Behavioral:  Negative for dysphoric mood.    Objective:  BP 128/84   Pulse 88   Temp 99 F (37.2 C) (Oral)   Ht 5\' 4"  (1.626 m)   Wt 212 lb (96.2 kg)   LMP 02/10/2021   SpO2 97%   BMI 36.39 kg/m   Wt Readings from Last 3 Encounters:  09/14/23 212 lb (96.2 kg)  07/20/23 219 lb 12.8 oz (99.7 kg)  05/04/23 225 lb (102.1 kg)      Physical Exam Constitutional:      General: She is not in acute distress.    Appearance: Normal appearance. She is well-developed. She is not ill-appearing or toxic-appearing.     Comments: Hoarse voice  HENT:     Head: Normocephalic.     Right Ear:  Hearing, tympanic membrane, ear canal and external ear normal. Tympanic membrane is not erythematous, retracted or bulging.     Left Ear: Hearing, tympanic membrane, ear canal and external ear normal. Tympanic membrane is not erythematous, retracted or bulging.     Nose: Congestion present. No mucosal edema or rhinorrhea.     Right Sinus: Maxillary sinus tenderness present. No frontal sinus tenderness.     Left Sinus: No maxillary sinus tenderness or frontal sinus tenderness.     Mouth/Throat:     Pharynx: Uvula midline.  Eyes:     General: Lids are normal. Lids are everted, no foreign bodies appreciated.     Conjunctiva/sclera: Conjunctivae normal.     Pupils: Pupils are equal, round, and reactive to light.  Neck:     Thyroid : No thyroid  mass or thyromegaly.     Vascular: No carotid bruit.     Trachea: Trachea normal.  Cardiovascular:     Rate and Rhythm: Normal rate and regular rhythm.     Pulses: Normal pulses.     Heart sounds: Normal heart sounds, S1 normal and S2 normal. No murmur heard.    No friction rub. No gallop.  Pulmonary:     Effort: Pulmonary effort is normal. No tachypnea or respiratory distress.     Breath sounds: Normal breath sounds. No decreased breath sounds, wheezing, rhonchi or rales.  Abdominal:     General: Bowel sounds are normal.     Palpations: Abdomen is soft.     Tenderness: There is no abdominal tenderness.  Musculoskeletal:     Cervical back: Normal range of motion and neck supple.  Skin:    General: Skin is warm and dry.     Findings: No rash.  Neurological:     Mental Status: She is alert.  Psychiatric:        Mood and Affect: Mood is not anxious or depressed.        Speech: Speech normal.        Behavior: Behavior normal. Behavior is cooperative.        Thought Content: Thought content normal.        Judgment: Judgment normal.       Results for orders placed or performed in visit on 07/20/23  HM PAP SMEAR   Collection Time: 02/05/22   3:59 PM  Result Value Ref Range   HM Pap smear NILM    HPV, high-risk Negative   HM DEXA SCAN   Collection Time: 02/23/23  3:09 PM  Result Value Ref Range   HM  Dexa Scan NORMAL     Assessment and Plan  Acute non-recurrent maxillary sinusitis Assessment & Plan: Follow-up acute, most likely initial allergies, now with concern for bacterial superinfection.  She also has an element of what sounds like reactive airway disease causing coughing fits and tickle in her throat. She has intolerable side effects to prednisone .  Will treat with amoxicillin  500 mg 2 capsules twice daily x 10 days to cover for bacterial superinfection. Encouraged her to continue her allergy regimen but add nasal saline irrigation.  She can use albuterol inhaler 2 puffs every 4-6 hours as needed for coughing fits.  Return and ER precautions provided    Seasonal allergic rhinitis due to pollen  Other orders -     Amoxicillin ; Take 2 capsules (1,000 mg total) by mouth 2 (two) times daily.  Dispense: 40 capsule; Refill: 0 -     Albuterol Sulfate HFA; Inhale 2 puffs into the lungs every 6 (six) hours as needed for wheezing or shortness of breath.  Dispense: 8 g; Refill: 2    No follow-ups on file.   Herby Lolling, MD

## 2023-11-28 ENCOUNTER — Ambulatory Visit: Admitting: Nurse Practitioner

## 2023-11-28 VITALS — BP 132/86 | HR 64 | Temp 98.2°F | Ht 64.0 in | Wt 213.6 lb

## 2023-11-28 DIAGNOSIS — H6991 Unspecified Eustachian tube disorder, right ear: Secondary | ICD-10-CM

## 2023-11-28 DIAGNOSIS — J0140 Acute pansinusitis, unspecified: Secondary | ICD-10-CM | POA: Insufficient documentation

## 2023-11-28 MED ORDER — AMOXICILLIN-POT CLAVULANATE 875-125 MG PO TABS: 1.0000 | ORAL_TABLET | Freq: Two times a day (BID) | ORAL | 0 refills | Status: AC

## 2023-11-28 NOTE — Assessment & Plan Note (Signed)
 Continue with Flonase  and Claritin as prescribed.  Patient to get over-the-counter Sudafed for the next 5 to 7 days.

## 2023-11-28 NOTE — Assessment & Plan Note (Signed)
 Given signs and symptoms and with illness will treat with Augmentin  875-125 mg twice daily for 7 days.  Offered to put patient on some prednisone  to help with ADD and facial pressure that she is unsure if she has had reaction to that in the past or not so we will defer.  Patient get over-the-counter Sudafed to use for 5 to 7 days as she has tolerated that in the past

## 2023-11-28 NOTE — Patient Instructions (Signed)
 Nice to see you today I have sent antibiotics to the pharmacy  You can get some sudafed behind the pharmacy counter to help with the fluid behind the ear and the pressure in your face Follow up if you do not improve

## 2023-11-28 NOTE — Progress Notes (Signed)
 Acute Office Visit  Subjective:     Patient ID: Sharon Chaney, female    DOB: [birth year], 58 y.o.   MRN: [id number]  Chief Complaint  Patient presents with   Sinusitis    Pt complains of possible sinus infection ongoing for 2 weeks. Pt states of swollen face on L side ,pressure, constant headache.     HPI Patient is in today for sick symptoms with a history of Migraine, asthma, GERD.  COVID-vaccine: Not up-to-date Flu vaccine: 02/01/2023, season  States that her symptoms started approx 2 weeks ago. States that she caught something from her grand daughter. State that she has a history of tinnitus and vertigo. She has been having some headaches and face fullness on the left side.   States that she has been taking the Claritin and Flonase  has been daily. States that she has not taken any other   States that 2 days ago she started having some tingling and numbness to the left side of the face   Review of Systems  Constitutional:  Negative for chills and fever.  HENT:  Positive for ear pain and sinus pain. Negative for ear discharge and sore throat (intermittent).   Respiratory:  Negative for cough and shortness of breath.   Cardiovascular:  Negative for chest pain.  Gastrointestinal:  Negative for abdominal pain, diarrhea, nausea and vomiting.  Musculoskeletal:  Negative for myalgias.  Neurological:  Positive for dizziness (swimmy headedness. with and without movement), tingling and headaches.        Objective:    BP 132/86   Pulse 64   Temp 98.2 F (36.8 C) (Oral)   Ht 5' 4 (1.626 m)   Wt 213 lb 9.6 oz (96.9 kg)   LMP 02/10/2021   SpO2 98%   BMI 36.66 kg/m    Physical Exam Vitals and nursing note reviewed.  Constitutional:      Appearance: Normal appearance.  HENT:     Head:     Comments: Clear fluid behind right TM    Right Ear: Ear canal and external ear normal.     Left Ear: Tympanic membrane, ear canal and external ear normal.     Nose:     Right  Sinus: Maxillary sinus tenderness and frontal sinus tenderness present.     Left Sinus: Maxillary sinus tenderness and frontal sinus tenderness present.     Mouth/Throat:     Mouth: Mucous membranes are moist.     Pharynx: Oropharynx is clear.  Eyes:     Pupils: Pupils are equal, round, and reactive to light.  Cardiovascular:     Rate and Rhythm: Normal rate and regular rhythm.     Heart sounds: Normal heart sounds.  Pulmonary:     Effort: Pulmonary effort is normal.     Breath sounds: Normal breath sounds.  Lymphadenopathy:     Cervical: No cervical adenopathy.  Neurological:     General: No focal deficit present.     Mental Status: She is alert.     Cranial Nerves: Cranial nerves 2-12 are intact.     Sensory: Sensation is intact.     Motor: Motor function is intact.     Gait: Gait is intact.     Deep Tendon Reflexes:     Reflex Scores:      Bicep reflexes are 2+ on the right side and 2+ on the left side.      Patellar reflexes are 2+ on the right side and 2+ on  the left side.    Comments: Bilateral upper and lower extremity strength 5/5     No results found for any visits on 11/28/23.      Assessment & Plan:   Problem List Items Addressed This Visit       Respiratory   Acute non-recurrent pansinusitis - Primary   Given signs and symptoms and with illness will treat with Augmentin  875-125 mg twice daily for 7 days.  Offered to put patient on some prednisone  to help with ADD and facial pressure that she is unsure if she has had reaction to that in the past or not so we will defer.  Patient get over-the-counter Sudafed to use for 5 to 7 days as she has tolerated that in the past      Relevant Medications   amoxicillin -clavulanate (AUGMENTIN ) 875-125 MG tablet     Nervous and Auditory   Dysfunction of right eustachian tube   Continue with Flonase  and Claritin as prescribed.  Patient to get over-the-counter Sudafed for the next 5 to 7 days.       Meds ordered this  encounter  Medications   amoxicillin -clavulanate (AUGMENTIN ) 875-125 MG tablet    Sig: Take 1 tablet by mouth 2 (two) times daily for 7 days.    Dispense:  14 tablet    Refill:  0    Supervising Provider:   RANDEEN HARDY A [[id number]]    Return if symptoms worsen or fail to improve.  Sharon Crandall, NP

## 2024-01-04 ENCOUNTER — Encounter: Payer: Self-pay | Admitting: Cardiovascular Disease

## 2024-01-04 ENCOUNTER — Ambulatory Visit: Attending: Cardiovascular Disease | Admitting: Cardiovascular Disease

## 2024-01-04 VITALS — BP 110/74 | HR 73 | Ht 64.0 in | Wt 212.8 lb

## 2024-01-04 DIAGNOSIS — Z8249 Family history of ischemic heart disease and other diseases of the circulatory system: Secondary | ICD-10-CM | POA: Diagnosis not present

## 2024-01-04 DIAGNOSIS — R55 Syncope and collapse: Secondary | ICD-10-CM

## 2024-01-04 DIAGNOSIS — R0789 Other chest pain: Secondary | ICD-10-CM | POA: Diagnosis not present

## 2024-01-04 DIAGNOSIS — E7800 Pure hypercholesterolemia, unspecified: Secondary | ICD-10-CM | POA: Diagnosis not present

## 2024-01-04 NOTE — Assessment & Plan Note (Signed)
 History of atypical chest pain in the past with a coronary calcium score performed/30/25 which was 0.  She has had no recurrent symptoms.

## 2024-01-04 NOTE — Progress Notes (Signed)
 01/04/2024 Sharon Chaney   October 03, 1965  [id number]  Primary Physician Avelina Greig BRAVO, MD Primary Cardiologist: Dorn JINNY Lesches MD [facility] [place], [place]  HPI:  Sharon Chaney is a 58 y.o.  moderately overweight married Caucasian female mother of 2 children, grandmother of 1 grandchild whose husband Sharon Chaney is also a patient of mine..  I last saw her in the office 07/20/2023.  She stays at home with her children.  Her risk factors include untreated hyperlipidemia and family history of with father had bypass surgery at age 78.  She is never had a heart attack or stroke.  She does complain of occasional atypical chest pain which does not sound ischemic.  She has had a normal 2D echo 01/03/2020.   On New Year's Day of this year she was at lunch with her family and noted to be presyncopal and diaphoretic.  She went to the ER where her workup was unrevealing.  She has had no subsequent episodes.  Her symptoms sound somewhat vagal.  Since I saw her 6 months ago I did get a coronary calcium score on/30/25 which is 0 as well as an event monitor that showed no arrhythmias.  She has had no recurrent symptoms.  She does have untreated hyperlipidemia with an LDL of 176.  We talked about starting statin therapy which the patient prefers not to pursue.   Current Meds  Medication Sig   acetaminophen  (TYLENOL ) 500 MG tablet Take 2 tablets (1,000 mg total) by mouth every 6 (six) hours as needed.   albuterol  (VENTOLIN  HFA) 108 (90 Base) MCG/ACT inhaler Inhale 2 puffs into the lungs every 6 (six) hours as needed for wheezing or shortness of breath.   COLLAGEN PO Take 1 Scoop by mouth daily.   Collagen-Boron-Hyaluronic Acid ([employer] JOINT HEALTH TRIPLE ACTION PO) Take 1 tablet by mouth in the morning.   fluticasone  (FLONASE ) 50 MCG/ACT nasal spray SPRAY 2 SPRAYS INTO EACH NOSTRIL EVERY DAY   loratadine (CLARITIN) 10 MG tablet Take 10 mg by mouth in the morning.   MAGNESIUM  PO Take 420 tablets by mouth 2 (two)  times daily.   meloxicam  (MOBIC ) 15 MG tablet TAKE 1 TABLET (15 MG TOTAL) BY MOUTH DAILY.   Omega-3 Fatty Acids (DIALYVITE OMEGA-3 CONCENTRATE) 600 MG CAPS Take 600 mg by mouth daily.   TURMERIC PO Take 1 tablet by mouth daily.   Vitamin D -Vitamin K (VITAMIN K2-VITAMIN D3 PO) Take 125 mcg by mouth daily.     Allergies  Allergen Reactions   Lactose Intolerance (Gi)    Promethazine Other (See Comments)    (Intolerant)--unknown reaction   Latex Rash   Tomato Rash    Social History   Socioeconomic History   Marital status: Married    Spouse name: Not on file   Number of children: Not on file   Years of education: Not on file   Highest education level: Not on file  Occupational History   Not on file  Tobacco Use   Smoking status: Never   Smokeless tobacco: Never  Vaping Use   Vaping status: Never Used  Substance and Sexual Activity   Alcohol use: No   Drug use: No   Sexual activity: Yes    Birth control/protection: None    Comment: Married  Other Topics Concern   Not on file  Social History Narrative   Not on file   Social Drivers of Health   Financial Resource Strain: Not on file  Food  Insecurity: Not on file  Transportation Needs: Not on file  Physical Activity: Not on file  Stress: Not on file  Social Connections: Unknown (09/13/2023)   Social Connection and Isolation Panel    Frequency of Communication with Friends and Family: Not on file    Frequency of Social Gatherings with Friends and Family: Not on file    Attends Religious Services: Not on file    Active Member of Clubs or Organizations: No    Attends Banker Meetings: Not on file    Marital Status: Married  Catering manager Violence: Not on file     Review of Systems: General: negative for chills, fever, night sweats or weight changes.  Cardiovascular: negative for chest pain, dyspnea on exertion, edema, orthopnea, palpitations, paroxysmal nocturnal dyspnea or shortness of  breath Dermatological: negative for rash Respiratory: negative for cough or wheezing Urologic: negative for hematuria Abdominal: negative for nausea, vomiting, diarrhea, bright red blood per rectum, melena, or hematemesis Neurologic: negative for visual changes, syncope, or dizziness All other systems reviewed and are otherwise negative except as noted above.    Blood pressure 110/74, pulse 73, height 5' 4 (1.626 m), weight 212 lb 12.8 oz (96.5 kg), last menstrual period 02/10/2021, SpO2 99%.  General appearance: alert and no distress Neck: no adenopathy, no carotid bruit, no JVD, supple, symmetrical, trachea midline, and thyroid  not enlarged, symmetric, no tenderness/mass/nodules Lungs: clear to auscultation bilaterally Heart: regular rate and rhythm, S1, S2 normal, no murmur, click, rub or gallop Extremities: extremities normal, atraumatic, no cyanosis or edema Pulses: 2+ and symmetric Skin: Skin color, texture, turgor normal. No rashes or lesions Neurologic: Grossly normal  EKG EKG Interpretation Date/Time:  Wednesday January 04 2024 08:45:03 EDT Ventricular Rate:  73 PR Interval:  158 QRS Duration:  74 QT Interval:  376 QTC Calculation: 414 R Axis:   13  Text Interpretation: Normal sinus rhythm Normal ECG When compared with ECG of 20-Jul-2023 09:22, No significant change was found Confirmed by Court Carrier [phone]) on 01/04/2024 8:50:16 AM    ASSESSMENT AND PLAN:   Atypical chest pain History of atypical chest pain in the past with a coronary calcium score performed/30/25 which was 0.  She has had no recurrent symptoms.  High cholesterol History of hyperlipidemia not on statin therapy with lipid profile performed 04/29/2023 revealing total cholesterol 247, LDL 176 and HDL L of 46.  She is not at goal for primary prevention (LDL less than 100).  We talked about starting statin therapy which the patient prefers not to pursue.  Pre-syncope History of presyncope which  occurred on New Year's Day last year.  She did have a event monitor which is entirely normal.  She has had no recurrent symptoms.  The presumption was that her event was vasovagal.  Family history of heart disease Family history of heart disease with father who had bypass surgery at age 36.  Her coronary calcium score was 0.     Carrier DOROTHA Court MD FACP,FACC,FAHA, [facility] 01/04/2024 8:57 AM

## 2024-01-04 NOTE — Assessment & Plan Note (Signed)
 History of hyperlipidemia not on statin therapy with lipid profile performed 04/29/2023 revealing total cholesterol 247, LDL 176 and HDL L of 46.  She is not at goal for primary prevention (LDL less than 100).  We talked about starting statin therapy which the patient prefers not to pursue.

## 2024-01-04 NOTE — Patient Instructions (Signed)

## 2024-01-04 NOTE — Assessment & Plan Note (Signed)
 History of presyncope which occurred on New Year's Day last year.  She did have a event monitor which is entirely normal.  She has had no recurrent symptoms.  The presumption was that her event was vasovagal.

## 2024-01-04 NOTE — Assessment & Plan Note (Signed)
 Family history of heart disease with father who had bypass surgery at age 57.  Her coronary calcium score was 0.

## 2024-01-06 IMAGING — MG MM DIGITAL SCREENING BILAT W/ TOMO AND CAD
8 series · 8 of 24 positions shown · non-contrast
Comparison: Previous exam(s).

CLINICAL DATA: Screening.

EXAM:
DIGITAL SCREENING BILATERAL MAMMOGRAM WITH TOMOSYNTHESIS AND CAD
TECHNIQUE: Bilateral screening digital craniocaudal and mediolateral oblique
mammograms were obtained. Bilateral screening digital breast
tomosynthesis was performed. The images were evaluated with
computer-aided detection.

[R MLO synth-2D]
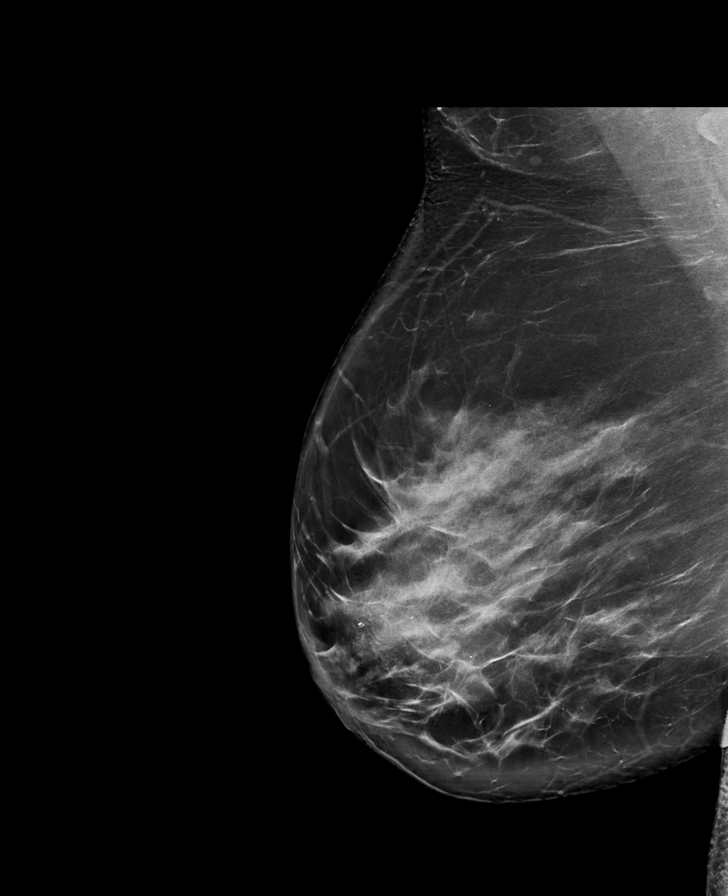

[R CC synth-2D]
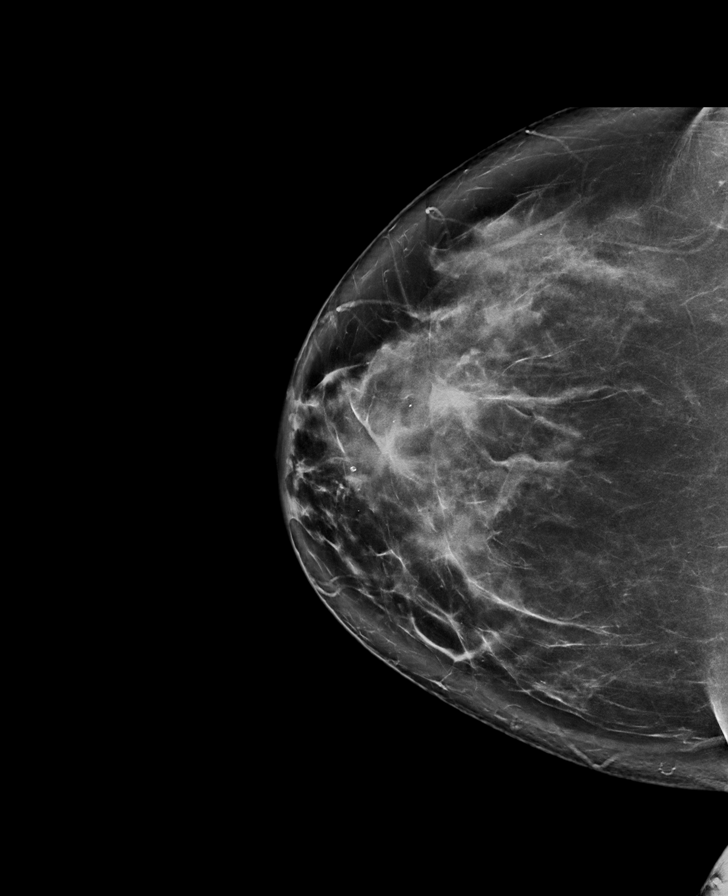

[L CC synth-2D]
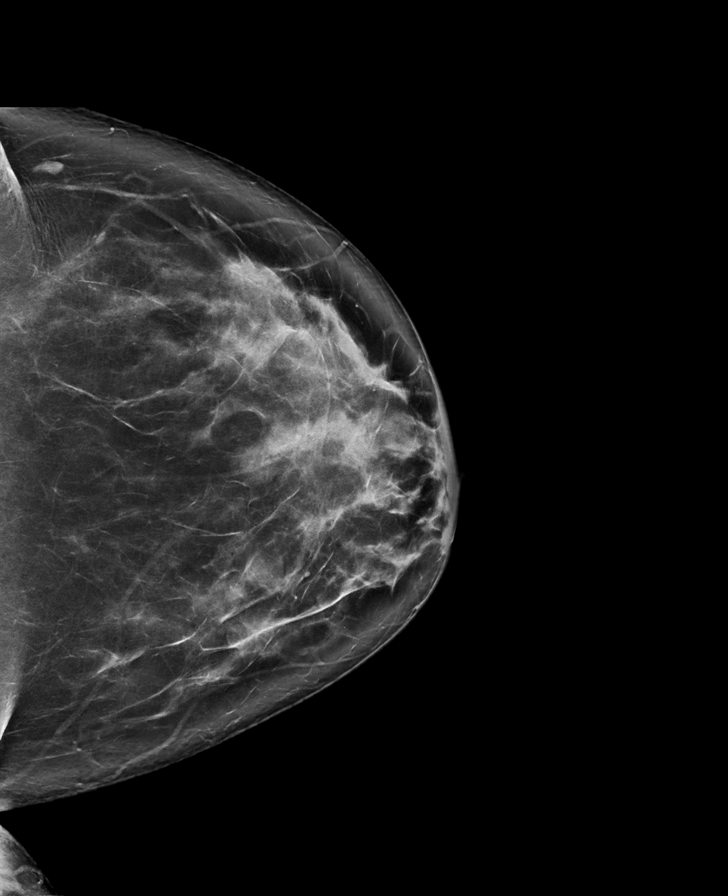

[L MLO synth-2D]
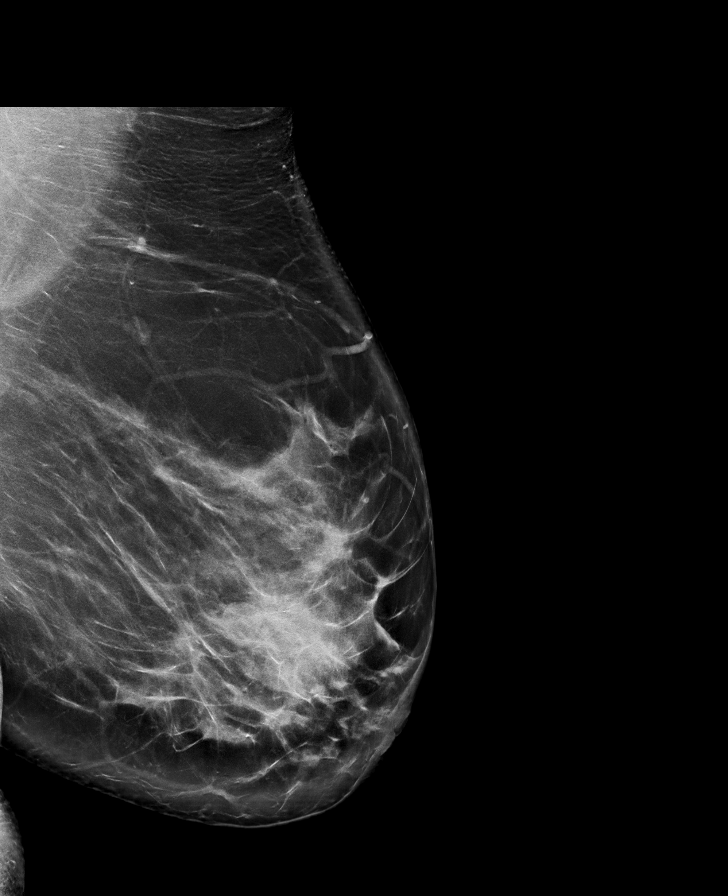

[R MLO tomo · tomo slice 53/105.0]
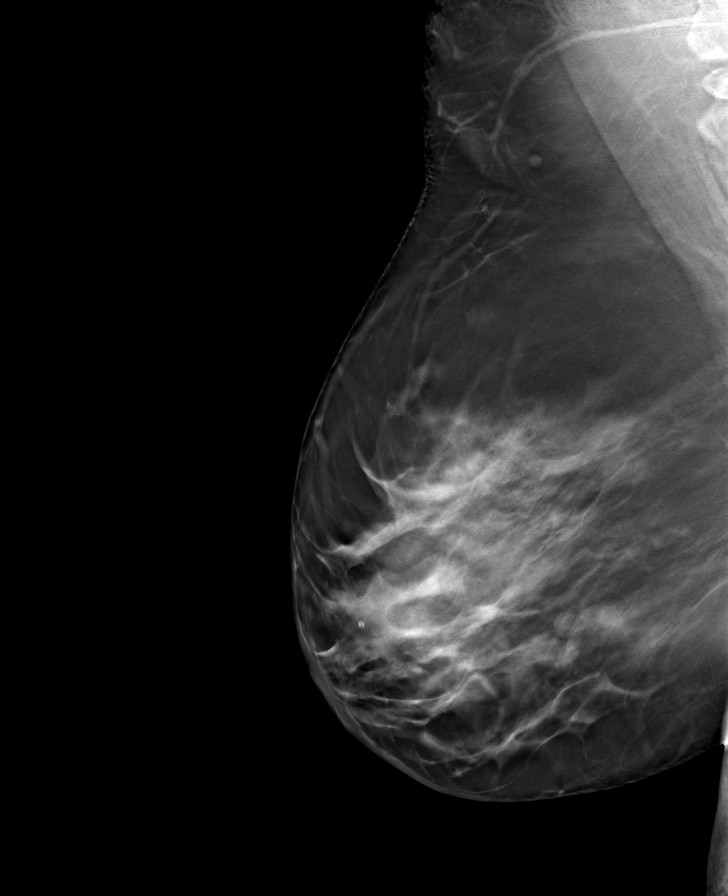

[L CC tomo · tomo slice 47/93.0]
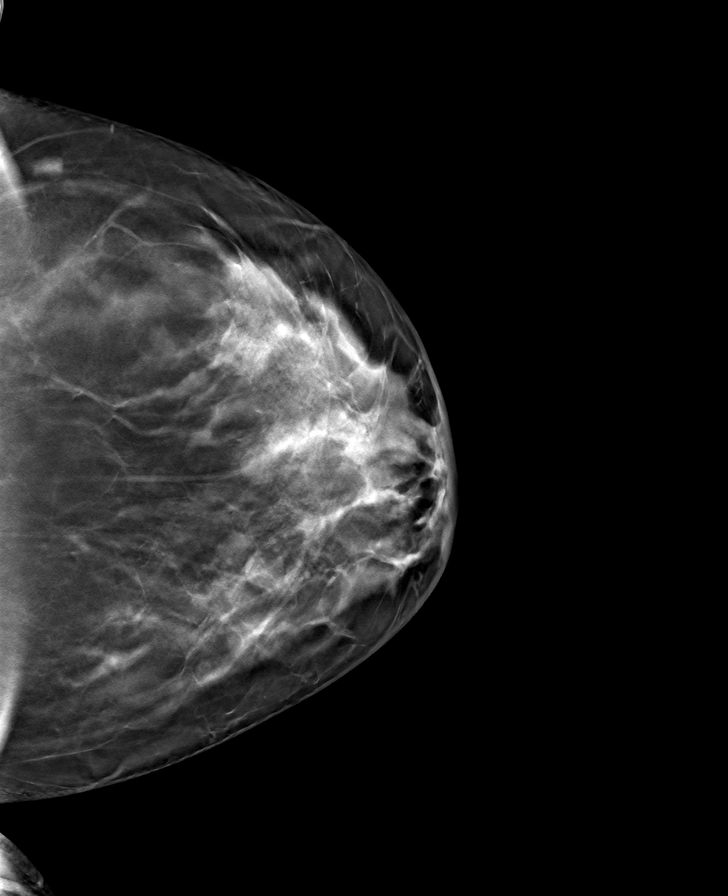

[L MLO tomo · tomo slice 52/103.0]
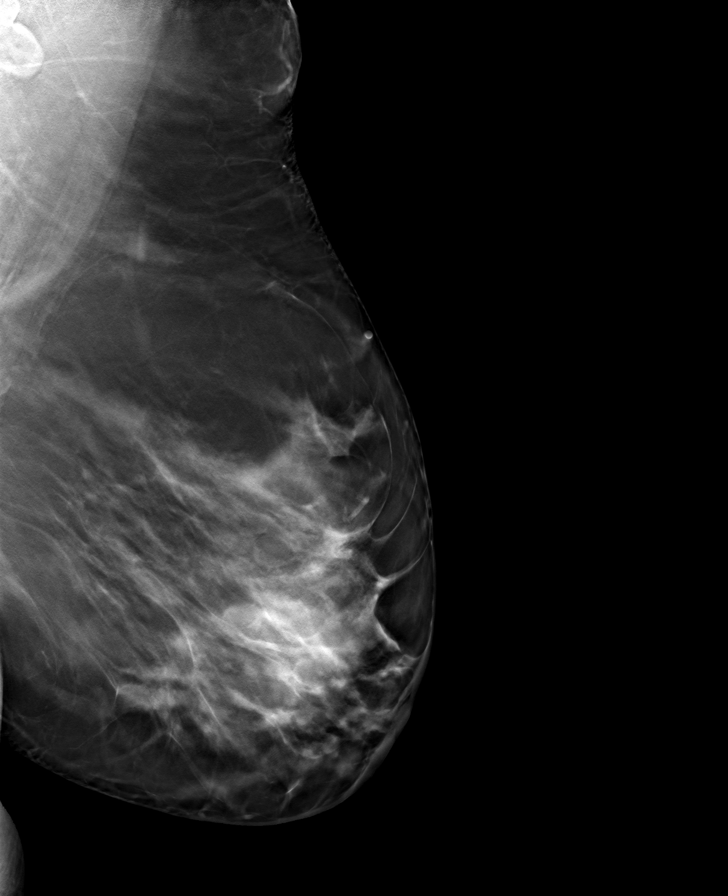

[R CC tomo · tomo slice 48/95.0]
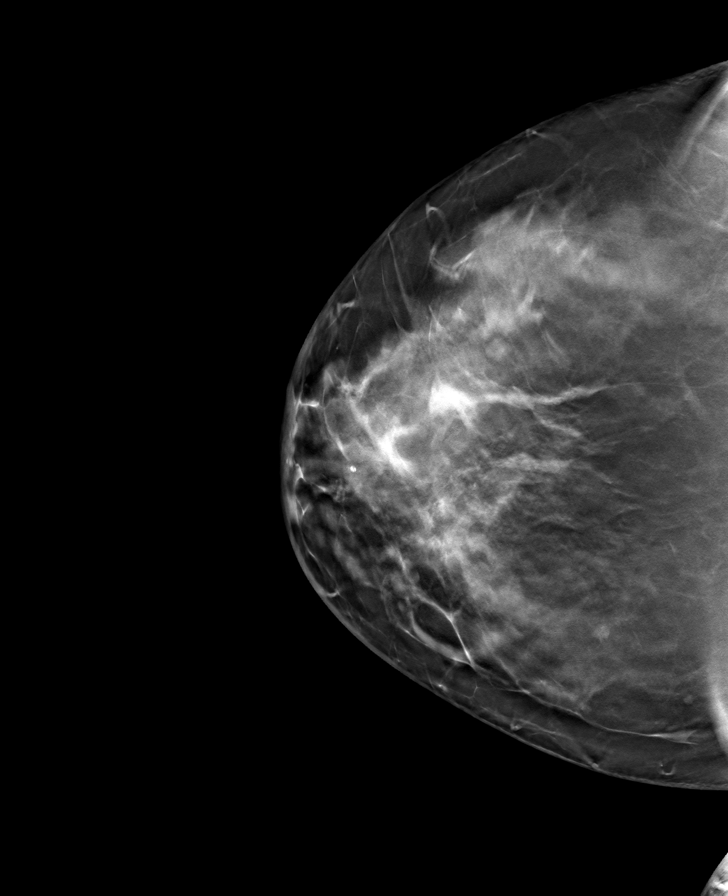

[8 of 24 positions shown; findings below may reference images not displayed]

ACR Breast Density Category c: The breast tissue is heterogeneously
dense, which may obscure small masses.
FINDINGS: There are no findings suspicious for malignancy.
IMPRESSION: No mammographic evidence of malignancy. A result letter of this
screening mammogram will be mailed directly to the patient.

RECOMMENDATION:
Screening mammogram in one year. (Code:Q3-W-BC3)

BI-RADS CATEGORY  1: Negative.

## 2024-02-27 ENCOUNTER — Encounter: Payer: Self-pay | Admitting: Radiology

## 2024-03-28 ENCOUNTER — Encounter: Payer: Self-pay | Admitting: Family Medicine

## 2024-03-28 ENCOUNTER — Ambulatory Visit
Admission: EM | Admit: 2024-03-28 | Discharge: 2024-03-28 | Disposition: A | Discharge status: Home / Self Care | Attending: Emergency Medicine | Admitting: Emergency Medicine

## 2024-03-28 DIAGNOSIS — J069 Acute upper respiratory infection, unspecified: Secondary | ICD-10-CM

## 2024-03-28 LAB — POC COVID19/FLU A&B COMBO
Covid Antigen, POC: NEGATIVE
Influenza A Antigen, POC: NEGATIVE
Influenza B Antigen, POC: NEGATIVE

## 2024-03-28 MED ORDER — BENZONATATE 100 MG PO CAPS: 100.0000 mg | ORAL_CAPSULE | Freq: Three times a day (TID) | ORAL | 0 refills | Status: DC | PRN

## 2024-03-28 NOTE — ED Triage Notes (Addendum)
 Patient to Urgent Care with complaints of fatigue/ headache/ nausea/ diarrhea/ cough and chest congestion. Denies any fevers.  Symptoms started Monday. Started with a rash on the back of her neck that has now resolved.   Taking allergy medication/ cough meds/ tylenol . Negative home covid test.

## 2024-03-28 NOTE — Discharge Instructions (Signed)
 The COVID and flu tests are negative.   Take the Tessalon  Perles as needed for cough.   Take Tylenol  or ibuprofen  as needed for fever or discomfort.  Take plain Mucinex  as needed for congestion.  Rest and keep yourself hydrated.    Follow-up with your primary care provider if your symptoms are not improving.

## 2024-03-28 NOTE — ED Provider Notes (Signed)
 Sharon Chaney    CSN: [id number] Arrival date & time: 03/28/24  1042      History   Chief Complaint Chief Complaint  Patient presents with   Cough   Fatigue    HPI Sharon Chaney is a 58 y.o. female.  Patient presents with 2-day history of fatigue, headache, congestion, cough, nausea, diarrhea.  No fever, shortness of breath, vomiting.  Treatment attempted with Tylenol  and allergy medication.  Negative COVID test at home.  The history is provided by the patient and medical records.    Past Medical History:  Diagnosis Date   Complication of anesthesia    Endometriosis    Morbid obesity (HCC)    Pneumonia    PONV (postoperative nausea and vomiting)     Patient Active Problem List   Diagnosis Date Noted   Acute non-recurrent pansinusitis 11/28/2023   Seasonal allergic rhinitis due to pollen 09/14/2023   Dizziness 07/20/2023   Family history of heart disease 07/20/2023   High cholesterol 05/04/2023   Pre-syncope 05/04/2023   Sebaceous cyst 08/21/2021   Flank pain 03/27/2021   Lower abdominal pain 03/27/2021   Left shoulder pain 12/26/2019   Chest wall pain 12/26/2019   Acute pain of left knee 06/27/2018   Dysfunction of right eustachian tube 07/28/2017   Decreased vision of right eye 07/28/2017   Migraine with aura 08/27/2015   Other fatigue 08/27/2015   Atypical chest pain 08/27/2015   Mild intermittent asthma in adult without complication 08/01/2014   Endometriosis 08/01/2014   Chronic pelvic pain in female 08/01/2014   GERD (gastroesophageal reflux disease) 08/01/2014   MENORRHAGIA 02/22/2008   STRESS INCONTINENCE 02/22/2008   Acute non-recurrent maxillary sinusitis 04/06/2007    Past Surgical History:  Procedure Laterality Date   APPENDECTOMY     CESAREAN SECTION     DILATATION & CURETTAGE/HYSTEROSCOPY WITH MYOSURE N/A 02/25/2021   Procedure: DILATATION & CURETTAGE/HYSTEROSCOPY WITH   MYOSURE, RESECTION OF ENDOMETRIAL FOBROID;  Surgeon:  Marne Lauretta Sallas Nest, MD;  Location: [facility] OR;  Service: Gynecology;  Laterality: N/A;   LAPAROTOMY     TONSILLECTOMY      OB History     Gravida  2   Para  2   Term  2   Preterm      AB      Living  2      SAB      IAB      Ectopic      Multiple      Live Births               Home Medications    Prior to Admission medications   Medication Sig Start Date End Date Taking? Authorizing Provider  benzonatate  (TESSALON ) 100 MG capsule Take 1 capsule (100 mg total) by mouth 3 (three) times daily as needed for cough. 03/28/24  Yes Corlis Burnard DEL, NP  acetaminophen  (TYLENOL ) 500 MG tablet Take 2 tablets (1,000 mg total) by mouth every 6 (six) hours as needed. 04/27/15   Eldridge, Serena Y, PA-C  albuterol  (VENTOLIN  HFA) 108 (90 Base) MCG/ACT inhaler Inhale 2 puffs into the lungs every 6 (six) hours as needed for wheezing or shortness of breath. 09/14/23   Bedsole, Amy E, MD  COLLAGEN PO Take 1 Scoop by mouth daily.    [provider]  Collagen-Boron-Hyaluronic Acid ([employer] JO[employer] HE[employer] TRIPLE ACTION PO) Take 1 tablet by mouth in the morning.    [provider]  fluticasone  (FLONASE ) 50  MCG/ACT nasal spray SPRAY 2 SPRAYS INTO EACH NOSTRIL EVERY DAY 07/09/19   Saguier, [place], PA-C  GLUCOSAMINE-CHONDROITIN PO Take 2 tablets by mouth daily.    [provider]  loratadine (CLARITIN) 10 MG tablet Take 10 mg by mouth in the morning.    [provider]  MAGNESIUM  PO Take 420 tablets by mouth 2 (two) times daily.    [provider]  meloxicam  (MOBIC ) 15 MG tablet TAKE 1 TABLET (15 MG TOTAL) BY MOUTH DAILY. 08/01/23   Bedsole, Amy E, MD  Omega-3 Fatty Acids (DIALYVITE OMEGA-3 CONCENTRATE) 600 MG CAPS Take 600 mg by mouth daily.    [provider]  TURMERIC PO Take 1 tablet by mouth daily.    [provider]  Vitamin D -Vitamin K (VITAMIN K2-VITAMIN D3 PO) Take 125 mcg by mouth daily.    [provider]    Family  History Family History  Problem Relation Age of Onset   Stroke Mother    Colon cancer Father 24   Heart disease Father    Arthritis Maternal Grandmother    Heart disease Paternal Grandmother    Diabetes Paternal Grandmother    Stroke Paternal Grandfather    Anesthesia problems Neg Hx    Hypotension Neg Hx    Malignant hyperthermia Neg Hx    Breast cancer Neg Hx     Social History Social History   Tobacco Use   Smoking status: Never   Smokeless tobacco: Never  Vaping Use   Vaping status: Never Used  Substance Use Topics   Alcohol use: No   Drug use: No     Allergies   Lactose intolerance (gi), Promethazine, Latex, and Tomato   Review of Systems Review of Systems  Constitutional:  Negative for chills and fever.  HENT:  Positive for congestion. Negative for ear pain and sore throat.   Respiratory:  Positive for cough. Negative for shortness of breath.   Gastrointestinal:  Positive for diarrhea and nausea. Negative for abdominal pain and vomiting.     Physical Exam Triage Vital Signs ED Triage Vitals [03/28/24 1219]  Encounter Vitals Group     BP 132/82     Girls Systolic BP Percentile      Girls Diastolic BP Percentile      Boys Systolic BP Percentile      Boys Diastolic BP Percentile      Pulse Rate 77     Resp 18     Temp 98.2 F (36.8 C)     Temp src      SpO2 98 %     Weight      Height      Head Circumference      Peak Flow      Pain Score      Pain Loc      Pain Education      Exclude from Growth Chart    No data found.  Updated Vital Signs BP 132/82   Pulse 77   Temp 98.2 F (36.8 C)   Resp 18   LMP 02/10/2021   SpO2 98%   Visual Acuity Right Eye Distance:   Left Eye Distance:   Bilateral Distance:    Right Eye Near:   Left Eye Near:    Bilateral Near:     Physical Exam Constitutional:      General: She is not in acute distress. HENT:     Right Ear: Tympanic membrane normal.     Left Ear: Tympanic  membrane normal.      Nose: Rhinorrhea present.     Mouth/Throat:     Mouth: Mucous membranes are moist.     Pharynx: Oropharynx is clear.  Cardiovascular:     Rate and Rhythm: Normal rate and regular rhythm.     Heart sounds: Normal heart sounds.  Pulmonary:     Effort: Pulmonary effort is normal. No respiratory distress.     Breath sounds: Normal breath sounds.  Neurological:     Mental Status: She is alert.      UC Treatments / Results  Labs (all labs ordered are listed, but only abnormal results are displayed) Labs Reviewed  POC COVID19/FLU A&B COMBO    EKG   Radiology No results found.  Procedures Procedures (including critical care time)  Medications Ordered in UC Medications - No data to display  Initial Impression / Assessment and Plan / UC Course  I have reviewed the triage vital signs and the nursing notes.  Pertinent labs & imaging results that were available during my care of the patient were reviewed by me and considered in my medical decision making (see chart for details).    Viral URI.  Afebrile and vital signs are stable.  Rapid COVID and flu negative.  Discussed symptomatic treatment including Tessalon  Perles as needed, Tylenol  or ibuprofen  as needed for fever or discomfort, plain Mucinex  as needed for congestion, rest, hydration.  Instructed patient to follow-up with her PCP if not improving.  ED precautions given.  Patient agrees to plan of care.  Final Clinical Impressions(s) / UC Diagnoses   Final diagnoses:  Viral URI     Discharge Instructions      The COVID and flu tests are negative.   Take the Tessalon  Perles as needed for cough.   Take Tylenol  or ibuprofen  as needed for fever or discomfort.  Take plain Mucinex  as needed for congestion.  Rest and keep yourself hydrated.    Follow-up with your primary care provider if your symptoms are not improving.         ED Prescriptions     Medication Sig Dispense Auth. Provider   benzonatate  (TESSALON )  100 MG capsule Take 1 capsule (100 mg total) by mouth 3 (three) times daily as needed for cough. 21 capsule Corlis Burnard DEL, NP      PDMP not reviewed this encounter.   Corlis Burnard DEL, NP 03/28/24 [phone]

## 2024-04-11 ENCOUNTER — Telehealth: Payer: Self-pay | Admitting: *Deleted

## 2024-04-11 DIAGNOSIS — Z13220 Encounter for screening for lipoid disorders: Secondary | ICD-10-CM

## 2024-04-11 DIAGNOSIS — R5383 Other fatigue: Secondary | ICD-10-CM

## 2024-04-11 NOTE — Telephone Encounter (Signed)
-----   Message from Veva Sharon Chaney sent at 04/11/2024  3:59 PM EST ----- Regarding: Lab orders for [name], 1.2.26 Patient is scheduled for CPX labs, please order future labs, Thanks , Veva

## 2024-04-27 ENCOUNTER — Other Ambulatory Visit (INDEPENDENT_AMBULATORY_CARE_PROVIDER_SITE_OTHER): Payer: No Typology Code available for payment source

## 2024-04-27 ENCOUNTER — Ambulatory Visit: Payer: Self-pay | Admitting: Family Medicine

## 2024-04-27 DIAGNOSIS — R5383 Other fatigue: Secondary | ICD-10-CM | POA: Diagnosis not present

## 2024-04-27 DIAGNOSIS — Z13220 Encounter for screening for lipoid disorders: Secondary | ICD-10-CM | POA: Diagnosis not present

## 2024-04-27 LAB — COMPREHENSIVE METABOLIC PANEL WITH GFR
ALT: 16 U/L (ref 3–35)
AST: 14 U/L (ref 5–37)
Albumin: 4.2 g/dL (ref 3.5–5.2)
Alkaline Phosphatase: 52 U/L (ref 39–117)
BUN: 15 mg/dL (ref 6–23)
CO2: 31 meq/L (ref 19–32)
Calcium: 9.4 mg/dL (ref 8.4–10.5)
Chloride: 103 meq/L (ref 96–112)
Creatinine, Ser: 0.92 mg/dL (ref 0.40–1.20)
GFR: 68.91 mL/min
Glucose, Bld: 97 mg/dL (ref 70–99)
Potassium: 4.8 meq/L (ref 3.5–5.1)
Sodium: 140 meq/L (ref 135–145)
Total Bilirubin: 0.4 mg/dL (ref 0.2–1.2)
Total Protein: 6.7 g/dL (ref 6.0–8.3)

## 2024-04-27 LAB — VITAMIN B12: Vitamin B-12: 647 pg/mL (ref 211–911)

## 2024-04-27 LAB — LIPID PANEL
Cholesterol: 265 mg/dL — ABNORMAL HIGH (ref 28–200)
HDL: 43.6 mg/dL
LDL Cholesterol: 180 mg/dL — ABNORMAL HIGH (ref 10–99)
NonHDL: 221.72
Total CHOL/HDL Ratio: 6
Triglycerides: 211 mg/dL — ABNORMAL HIGH (ref 10.0–149.0)
VLDL: 42.2 mg/dL — ABNORMAL HIGH (ref 0.0–40.0)

## 2024-04-27 LAB — VITAMIN D 25 HYDROXY (VIT D DEFICIENCY, FRACTURES): VITD: 50.21 ng/mL (ref 30.00–100.00)

## 2024-04-27 NOTE — Progress Notes (Signed)
 No critical labs need to be addressed urgently. We will discuss labs in detail at upcoming office visit.

## 2024-05-04 ENCOUNTER — Encounter: Payer: Self-pay | Admitting: Family Medicine

## 2024-05-04 ENCOUNTER — Other Ambulatory Visit: Payer: Self-pay | Admitting: Family Medicine

## 2024-05-04 ENCOUNTER — Ambulatory Visit: Payer: No Typology Code available for payment source | Admitting: Family Medicine

## 2024-05-04 VITALS — BP 140/84 | HR 65 | Temp 98.1°F | Ht 64.0 in | Wt 217.2 lb

## 2024-05-04 DIAGNOSIS — E7800 Pure hypercholesterolemia, unspecified: Secondary | ICD-10-CM

## 2024-05-04 DIAGNOSIS — J4520 Mild intermittent asthma, uncomplicated: Secondary | ICD-10-CM | POA: Diagnosis not present

## 2024-05-04 DIAGNOSIS — Z23 Encounter for immunization: Secondary | ICD-10-CM

## 2024-05-04 DIAGNOSIS — Z0000 Encounter for general adult medical examination without abnormal findings: Secondary | ICD-10-CM

## 2024-05-04 DIAGNOSIS — R55 Syncope and collapse: Secondary | ICD-10-CM

## 2024-05-04 DIAGNOSIS — Z1231 Encounter for screening mammogram for malignant neoplasm of breast: Secondary | ICD-10-CM

## 2024-05-04 NOTE — Assessment & Plan Note (Signed)
"   Felt by cardiology to be vasovagal.   No further episodes "

## 2024-05-04 NOTE — Patient Instructions (Addendum)
"   Call when decide to move forward with colonoscopy.   Here is some info I have gathered for you for a trusted medical source. Lipid Management With Diet, Uptodate Feb 20.2021, [place] and [place]  Although earlier, smaller trials suggested a benefit of garlic supplementation, a subsequent larger trial failed to demonstrate improvement in lipids with use of any of three different garlic preparations (raw, powdered, or aged).  Bergamot: Improvements in serum lipids have been reported in trials of patients with metabolic syndrome, nonalcoholic fatty liver disease and in hyperlipidemic patients resistant to statin treatment. However, high-quality data on the effects of bergamot are lacking.  Suggestions for you if you would like to try natural supplements to lower cholesterol.  1.Souble fiber : Psyllium In a meta-analysis of randomized trials of patients with both normal and elevated cholesterol levels, the addition of 10.2 g/day of psyllium lowered the LDL cholesterol by an average of 12.8 mg/dL   2. Omega 3s: Mixed results in studies. Given you triglycerides are normal I would not use this.  3.Red yeast rice ( 2.4 grams divided half in AM half in PM): Red yeast rice is a fermented rice product, most often taken as a supplement, which can improve serum cholesterol  via  method similar to prescription statins.  Red yeast rice supplements lowered total cholesterol (208 versus 251 mg/dL) and LDL cholesterol (864 versus 175 mg/dL) compared with placebo.  4. Plant sterol.. There are naturally occurring sterols and stanols in nuts, legumes, whole grains, fruits, vegetables, and plant oils. In addition, a number of manufactured products enriched with plant sterols and stanols are commercially available. The margarines containing these compounds (eg, Benecol and Take Control spreads) have been available the longest and are the most studied  In a trial of 150 patients with mild hypercholesterolemia,those  consuming the fortified margarine experienced a 10 to 14 percent decrease in total cholesterol and LDL cholesterol.  5.Green Tea Catechins: .n a year-long randomized trial of more than 900 healthy postmenopausal women, green tea catechin supplements (1315 mg catechins/day) reduced total cholesterol and LDL cholesterol, increased triglycerides, and had no effect on HDL cholesterol   "

## 2024-05-04 NOTE — Progress Notes (Signed)
 "   Patient ID: Sharon Chaney, female    DOB: 02-08-1966, 59 y.o.   MRN: [id number]  This visit was conducted in person.  BP (!) 140/84   Pulse 65   Temp 98.1 F (36.7 C) (Temporal)   Ht 5' 4 (1.626 m)   Wt 217 lb 4 oz (98.5 kg)   LMP 02/10/2021   SpO2 98%   BMI 37.29 kg/m    CC:  Chief Complaint  Patient presents with   Annual Exam    Subjective:   HPI: Sharon Chaney is a 59 y.o. female presenting on 05/04/2024 for Annual Exam  The patient presents for complete physical and review of chronic health problems. He/She also has the following acute concerns today:    BP Readings from Last 3 Encounters:  05/04/24 (!) 140/84  03/28/24 132/82  01/04/24 110/74   Vitamin D  and vitamin B12 are in the normal range with supplementation.  Elevated Cholesterol:   Stonrg family history.. father with MI age 63. Treadmill  2018 nml.  Reviewed OV from Dr. Court 12/2023  Calcium score 0  NOT ON STAIN as indicated but pt not interested.  He felt presyncope was vasovagal. The 10-year ASCVD risk score (Arnett DK, et al., 2019) is: 4.5%   Values used to calculate the score:     Age: 61 years     Clinically relevant sex: Female     Is Non-Hispanic African American: No     Diabetic: No     Tobacco smoker: No     Systolic Blood Pressure: 140 mmHg     Is BP treated: No     HDL Cholesterol: 43.6 mg/dL     Total Cholesterol: 265 mg/dL  Lab Results  Component Value Date   CHOL 265 (H) 04/27/2024   HDL 43.60 04/27/2024   LDLCALC 180 (H) 04/27/2024   LDLDIRECT 130.4 03/15/2008   TRIG 211.0 (H) 04/27/2024   CHOLHDL 6 04/27/2024   The 10-year ASCVD risk score (Arnett DK, et al., 2019) is: 4.5%   Values used to calculate the score:     Age: 25 years     Clinically relevant sex: Female     Is Non-Hispanic African American: No     Diabetic: No     Tobacco smoker: No     Systolic Blood Pressure: 140 mmHg     Is BP treated: No     HDL Cholesterol: 43.6 mg/dL     Total  Cholesterol: 265 mg/dL Using medications without problems: Muscle aches:  Diet compliance: poor Exercise: walking daily Other complaints:   Wt Readings from Last 3 Encounters:  05/04/24 217 lb 4 oz (98.5 kg)  01/04/24 212 lb 12.8 oz (96.5 kg)  11/28/23 213 lb 9.6 oz (96.9 kg)  Body mass index is 37.29 kg/m.  GERD: Stable control on omeprazole  20 mg daily  Mild intermittent asthma: No recent flares.  Not requiring albuterol  inhaler or controller.  She treats allergic rhinitis with Flonase  2 sprays per nostril daily and Claritin 10 mg daily.  Using netty pot  Migraine with aura:  rare occurrence.  Relevant past medical, surgical, family and social history reviewed and updated as indicated. Interim medical history since our last visit reviewed. Allergies and medications reviewed and updated. Outpatient Medications Prior to Visit  Medication Sig Dispense Refill   acetaminophen  (TYLENOL ) 500 MG tablet Take 2 tablets (1,000 mg total) by mouth every 6 (six) hours as needed. 30 tablet 0   albuterol  (VENTOLIN   HFA) 108 (90 Base) MCG/ACT inhaler Inhale 2 puffs into the lungs every 6 (six) hours as needed for wheezing or shortness of breath. 8 g 2   Biotin 5000 MCG CAPS      Collagen-Boron-Hyaluronic Acid ([employer] JOINT HEALTH TRIPLE ACTION PO) Take 1 tablet by mouth in the morning.     fluticasone  (FLONASE ) 50 MCG/ACT nasal spray SPRAY 2 SPRAYS INTO EACH NOSTRIL EVERY DAY 16 mL 1   loratadine (CLARITIN) 10 MG tablet Take 10 mg by mouth in the morning.     MAGNESIUM  PO Take 420 tablets by mouth 2 (two) times daily.     meloxicam  (MOBIC ) 15 MG tablet TAKE 1 TABLET (15 MG TOTAL) BY MOUTH DAILY. 30 tablet 0   Omega-3 Fatty Acids (DIALYVITE OMEGA-3 CONCENTRATE) 600 MG CAPS Take 600 mg by mouth daily.     TURMERIC PO Take 1 tablet by mouth daily.     Vitamin D -Vitamin K (VITAMIN K2-VITAMIN D3 PO) Take 125 mcg by mouth daily.     benzonatate  (TESSALON ) 100 MG capsule Take 1 capsule (100 mg total) by  mouth 3 (three) times daily as needed for cough. 21 capsule 0   COLLAGEN PO Take 1 Scoop by mouth daily.     GLUCOSAMINE-CHONDROITIN PO Take 2 tablets by mouth daily.     No facility-administered medications prior to visit.     Per HPI unless specifically indicated in ROS section below Review of Systems  Constitutional:  Negative for fatigue and fever.  HENT:  Negative for congestion.   Eyes:  Negative for pain.  Respiratory:  Negative for cough and shortness of breath.   Cardiovascular:  Negative for chest pain, palpitations and leg swelling.  Gastrointestinal:  Negative for abdominal pain.  Genitourinary:  Negative for dysuria and vaginal bleeding.  Musculoskeletal:  Negative for back pain.  Neurological:  Negative for syncope, light-headedness and headaches.  Psychiatric/Behavioral:  Negative for dysphoric mood.    Objective:  BP (!) 140/84   Pulse 65   Temp 98.1 F (36.7 C) (Temporal)   Ht 5' 4 (1.626 m)   Wt 217 lb 4 oz (98.5 kg)   LMP 02/10/2021   SpO2 98%   BMI 37.29 kg/m   Wt Readings from Last 3 Encounters:  05/04/24 217 lb 4 oz (98.5 kg)  01/04/24 212 lb 12.8 oz (96.5 kg)  11/28/23 213 lb 9.6 oz (96.9 kg)      Physical Exam Constitutional:      General: She is not in acute distress.    Appearance: Normal appearance. She is well-developed. She is not ill-appearing or toxic-appearing.  HENT:     Head: Normocephalic.     Right Ear: Hearing, tympanic membrane, ear canal and external ear normal. Tympanic membrane is not erythematous, retracted or bulging.     Left Ear: Hearing, tympanic membrane, ear canal and external ear normal. Tympanic membrane is not erythematous, retracted or bulging.     Nose: No mucosal edema or rhinorrhea.     Right Sinus: No maxillary sinus tenderness or frontal sinus tenderness.     Left Sinus: No maxillary sinus tenderness or frontal sinus tenderness.     Mouth/Throat:     Pharynx: Uvula midline.  Eyes:     General: Lids are  normal. Lids are everted, no foreign bodies appreciated.     Conjunctiva/sclera: Conjunctivae normal.     Pupils: Pupils are equal, round, and reactive to light.  Neck:     Thyroid : No thyroid  mass or  thyromegaly.     Vascular: No carotid bruit.     Trachea: Trachea normal.  Cardiovascular:     Rate and Rhythm: Normal rate and regular rhythm.     Pulses: Normal pulses.     Heart sounds: Normal heart sounds, S1 normal and S2 normal. No murmur heard.    No friction rub. No gallop.  Pulmonary:     Effort: Pulmonary effort is normal. No tachypnea or respiratory distress.     Breath sounds: Normal breath sounds. No decreased breath sounds, wheezing, rhonchi or rales.  Abdominal:     General: Bowel sounds are normal.     Palpations: Abdomen is soft.     Tenderness: There is no abdominal tenderness.  Musculoskeletal:     Cervical back: Normal range of motion and neck supple.  Skin:    General: Skin is warm and dry.     Findings: No rash.  Neurological:     Mental Status: She is alert.  Psychiatric:        Mood and Affect: Mood is not anxious or depressed.        Speech: Speech normal.        Behavior: Behavior normal. Behavior is cooperative.        Thought Content: Thought content normal.        Judgment: Judgment normal.       Results for orders placed or performed in visit on 04/27/24  Comprehensive metabolic panel   Collection Time: 04/27/24  8:04 AM  Result Value Ref Range   Sodium 140 135 - 145 mEq/L   Potassium 4.8 3.5 - 5.1 mEq/L   Chloride 103 96 - 112 mEq/L   CO2 31 19 - 32 mEq/L   Glucose, Bld 97 70 - 99 mg/dL   BUN 15 6 - 23 mg/dL   Creatinine, Ser 9.07 0.40 - 1.20 mg/dL   Total Bilirubin 0.4 0.2 - 1.2 mg/dL   Alkaline Phosphatase 52 39 - 117 U/L   AST 14 5 - 37 U/L   ALT 16 3 - 35 U/L   Total Protein 6.7 6.0 - 8.3 g/dL   Albumin 4.2 3.5 - 5.2 g/dL   GFR 31.08 >39.99 mL/min   Calcium 9.4 8.4 - 10.5 mg/dL  Vitamin A87   Collection Time: 04/27/24  8:04 AM   Result Value Ref Range   Vitamin B-12 647 211 - 911 pg/mL  VITAMIN D  25 Hydroxy (Vit-D Deficiency, Fractures)   Collection Time: 04/27/24  8:04 AM  Result Value Ref Range   VITD 50.21 30.00 - 100.00 ng/mL  Lipid panel   Collection Time: 04/27/24  8:04 AM  Result Value Ref Range   Cholesterol 265 (H) 28 - 200 mg/dL   Triglycerides 788.9 (H) 10.0 - 149.0 mg/dL   HDL 56.39 >60.99 mg/dL   VLDL 57.7 (H) 0.0 - 59.9 mg/dL   LDL Cholesterol 819 (H) 10 - 99 mg/dL   Total CHOL/HDL Ratio 6    NonHDL 221.72     This visit occurred during the SARS-CoV-2 public health emergency.  Safety protocols were in place, including screening questions prior to the visit, additional usage of staff PPE, and extensive cleaning of exam room while observing appropriate contact time as indicated for disinfecting solutions.   COVID 19 screen:  No recent travel or known exposure to COVID19 The patient denies respiratory symptoms of COVID 19 at this time. The importance of social distancing was discussed today.   Assessment and Plan   The  patient's preventative maintenance and recommended screening tests for an annual wellness exam were reviewed in full today. Brought up to date unless services declined.  Counselled on the importance of diet, exercise, and its role in overall health and mortality. The patient's FH and SH was reviewed, including their home life, tobacco status, and drug and alcohol status.   Vaccines: Up-to-date with flu and tetanus, consider shingles., PNA vaccine. Pap/DVE: [facility] Mammo: 05/2023 ,repeat screening due 2/202 Colon: father with colon cancer .SABRA overdue for screening..  discussed options.. she will consdier but is hesitant. She states she would not treat cancer if found given her Dad's experience. Smoking Status: none ETOH/ drug use:  none/none  Hep C: Due  HIV screen: Refused  Problem List Items Addressed This Visit     High cholesterol     Chronic, inadequate control She is not at goal for primary prevention (LDL less than 100).   Strong family history of CAD but 2025 coronary calcium score  0. Patient prefers not to pursue statin medication. Info given about red yeast rice and natural medications.      Mild intermittent asthma in adult without complication   Chronic stable control.      Pre-syncope    Felt by cardiology to be vasovagal.   No further episodes      Other Visit Diagnoses       Routine general medical examination at a health care facility    -  Primary       Return in about 1 year (around 05/04/2025) for annual physical with fasting labs prior.   Greig Ring, MD   "

## 2024-05-04 NOTE — Assessment & Plan Note (Addendum)
"   Chronic, inadequate control She is not at goal for primary prevention (LDL less than 100).   Strong family history of CAD but 2025 coronary calcium score  0. Patient prefers not to pursue statin medication. Info given about red yeast rice and natural medications. "

## 2024-05-04 NOTE — Assessment & Plan Note (Signed)
Chronic stable control 

## 2024-05-04 NOTE — Addendum Note (Signed)
 Addended by: WENDELL ARLAND RAMAN on: 05/04/2024 09:54 AM   Modules accepted: Orders

## 2024-06-13 ENCOUNTER — Ambulatory Visit

## 2025-05-08 ENCOUNTER — Encounter: Admitting: Family Medicine
